# Patient Record
Sex: Female | Born: 1985 | Race: Black or African American | Hispanic: No | Marital: Married | Smoking: Current every day smoker
Health system: Southern US, Community
[De-identification: ages and names within clinical notes are randomized; demographics above are authoritative.]

## PROBLEM LIST (undated history)

## (undated) ENCOUNTER — Inpatient Hospital Stay (HOSPITAL_COMMUNITY): Payer: Self-pay

## (undated) DIAGNOSIS — F329 Major depressive disorder, single episode, unspecified: Secondary | ICD-10-CM

## (undated) DIAGNOSIS — F32A Depression, unspecified: Secondary | ICD-10-CM

## (undated) DIAGNOSIS — E039 Hypothyroidism, unspecified: Secondary | ICD-10-CM

## (undated) DIAGNOSIS — I10 Essential (primary) hypertension: Secondary | ICD-10-CM

## (undated) DIAGNOSIS — G039 Meningitis, unspecified: Secondary | ICD-10-CM

## (undated) DIAGNOSIS — N83209 Unspecified ovarian cyst, unspecified side: Secondary | ICD-10-CM

## (undated) DIAGNOSIS — E079 Disorder of thyroid, unspecified: Secondary | ICD-10-CM

## (undated) DIAGNOSIS — IMO0002 Reserved for concepts with insufficient information to code with codable children: Secondary | ICD-10-CM

## (undated) DIAGNOSIS — K219 Gastro-esophageal reflux disease without esophagitis: Secondary | ICD-10-CM

## (undated) DIAGNOSIS — T7840XA Allergy, unspecified, initial encounter: Secondary | ICD-10-CM

## (undated) DIAGNOSIS — R002 Palpitations: Secondary | ICD-10-CM

## (undated) DIAGNOSIS — F419 Anxiety disorder, unspecified: Secondary | ICD-10-CM

## (undated) DIAGNOSIS — N390 Urinary tract infection, site not specified: Secondary | ICD-10-CM

## (undated) HISTORY — PX: THERAPEUTIC ABORTION: SHX798

## (undated) HISTORY — PX: WISDOM TOOTH EXTRACTION: SHX21

## (undated) HISTORY — DX: Gastro-esophageal reflux disease without esophagitis: K21.9

## (undated) HISTORY — DX: Allergy, unspecified, initial encounter: T78.40XA

---
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
## 2003-01-16 ENCOUNTER — Other Ambulatory Visit: Admission: RE | Admit: 2003-01-16 | Discharge: 2003-01-16 | Payer: Self-pay | Admitting: Family Medicine

## 2003-01-16 ENCOUNTER — Encounter (INDEPENDENT_AMBULATORY_CARE_PROVIDER_SITE_OTHER): Payer: Self-pay | Admitting: *Deleted

## 2003-01-16 ENCOUNTER — Encounter: Admission: RE | Admit: 2003-01-16 | Discharge: 2003-01-16 | Payer: Self-pay | Admitting: Sports Medicine

## 2003-02-17 ENCOUNTER — Encounter: Admission: RE | Admit: 2003-02-17 | Discharge: 2003-02-17 | Payer: Self-pay | Admitting: Sports Medicine

## 2003-06-25 ENCOUNTER — Inpatient Hospital Stay (HOSPITAL_COMMUNITY): Admission: AD | Admit: 2003-06-25 | Discharge: 2003-06-25 | Payer: Self-pay | Admitting: Obstetrics & Gynecology

## 2003-07-30 ENCOUNTER — Inpatient Hospital Stay (HOSPITAL_COMMUNITY): Admission: AD | Admit: 2003-07-30 | Discharge: 2003-07-30 | Payer: Self-pay | Admitting: Family Medicine

## 2003-07-31 ENCOUNTER — Encounter: Admission: RE | Admit: 2003-07-31 | Discharge: 2003-07-31 | Payer: Self-pay | Admitting: Sports Medicine

## 2003-08-28 ENCOUNTER — Encounter (INDEPENDENT_AMBULATORY_CARE_PROVIDER_SITE_OTHER): Payer: Self-pay | Admitting: Specialist

## 2003-08-28 ENCOUNTER — Encounter: Admission: RE | Admit: 2003-08-28 | Discharge: 2003-08-28 | Payer: Self-pay | Admitting: Family Medicine

## 2003-10-21 ENCOUNTER — Inpatient Hospital Stay (HOSPITAL_COMMUNITY): Admission: AD | Admit: 2003-10-21 | Discharge: 2003-10-21 | Payer: Self-pay | Admitting: Obstetrics and Gynecology

## 2003-11-04 ENCOUNTER — Inpatient Hospital Stay (HOSPITAL_COMMUNITY): Admission: AD | Admit: 2003-11-04 | Discharge: 2003-11-04 | Payer: Self-pay | Admitting: Obstetrics and Gynecology

## 2003-11-10 ENCOUNTER — Ambulatory Visit: Payer: Self-pay | Admitting: Family Medicine

## 2003-11-11 ENCOUNTER — Ambulatory Visit (HOSPITAL_COMMUNITY): Admission: RE | Admit: 2003-11-11 | Discharge: 2003-11-11 | Payer: Self-pay

## 2003-12-08 ENCOUNTER — Ambulatory Visit: Payer: Self-pay | Admitting: Family Medicine

## 2003-12-18 ENCOUNTER — Inpatient Hospital Stay (HOSPITAL_COMMUNITY): Admission: AD | Admit: 2003-12-18 | Discharge: 2003-12-18 | Payer: Self-pay | Admitting: *Deleted

## 2003-12-31 ENCOUNTER — Ambulatory Visit: Payer: Self-pay | Admitting: Family Medicine

## 2004-01-14 ENCOUNTER — Ambulatory Visit: Payer: Self-pay | Admitting: Family Medicine

## 2004-01-21 ENCOUNTER — Inpatient Hospital Stay (HOSPITAL_COMMUNITY): Admission: AD | Admit: 2004-01-21 | Discharge: 2004-01-21 | Payer: Self-pay | Admitting: Obstetrics and Gynecology

## 2004-01-22 ENCOUNTER — Inpatient Hospital Stay (HOSPITAL_COMMUNITY): Admission: AD | Admit: 2004-01-22 | Discharge: 2004-01-22 | Payer: Self-pay | Admitting: *Deleted

## 2004-01-29 ENCOUNTER — Ambulatory Visit: Payer: Self-pay | Admitting: Family Medicine

## 2004-01-31 ENCOUNTER — Inpatient Hospital Stay (HOSPITAL_COMMUNITY): Admission: AD | Admit: 2004-01-31 | Discharge: 2004-02-03 | Payer: Self-pay | Admitting: Obstetrics & Gynecology

## 2004-02-01 ENCOUNTER — Encounter (INDEPENDENT_AMBULATORY_CARE_PROVIDER_SITE_OTHER): Payer: Self-pay | Admitting: *Deleted

## 2004-02-08 ENCOUNTER — Ambulatory Visit: Payer: Self-pay | Admitting: *Deleted

## 2004-02-08 ENCOUNTER — Inpatient Hospital Stay (HOSPITAL_COMMUNITY): Admission: AD | Admit: 2004-02-08 | Discharge: 2004-02-10 | Payer: Self-pay | Admitting: Obstetrics and Gynecology

## 2004-09-07 ENCOUNTER — Inpatient Hospital Stay (HOSPITAL_COMMUNITY): Admission: AD | Admit: 2004-09-07 | Discharge: 2004-09-07 | Payer: Self-pay | Admitting: Obstetrics and Gynecology

## 2004-09-08 ENCOUNTER — Encounter (INDEPENDENT_AMBULATORY_CARE_PROVIDER_SITE_OTHER): Payer: Self-pay | Admitting: *Deleted

## 2004-09-08 ENCOUNTER — Ambulatory Visit: Payer: Self-pay | Admitting: Family Medicine

## 2004-09-08 ENCOUNTER — Other Ambulatory Visit: Admission: RE | Admit: 2004-09-08 | Discharge: 2004-09-08 | Payer: Self-pay | Admitting: Family Medicine

## 2004-11-10 ENCOUNTER — Ambulatory Visit: Payer: Self-pay | Admitting: Family Medicine

## 2005-04-06 ENCOUNTER — Inpatient Hospital Stay (HOSPITAL_COMMUNITY): Admission: AD | Admit: 2005-04-06 | Discharge: 2005-04-06 | Payer: Self-pay | Admitting: Obstetrics & Gynecology

## 2005-08-17 ENCOUNTER — Inpatient Hospital Stay (HOSPITAL_COMMUNITY): Admission: AD | Admit: 2005-08-17 | Discharge: 2005-08-17 | Payer: Self-pay | Admitting: Gynecology

## 2005-08-20 ENCOUNTER — Ambulatory Visit: Payer: Self-pay | Admitting: Family Medicine

## 2005-08-20 ENCOUNTER — Ambulatory Visit: Payer: Self-pay | Admitting: Neonatology

## 2005-08-20 ENCOUNTER — Inpatient Hospital Stay (HOSPITAL_COMMUNITY): Admission: AD | Admit: 2005-08-20 | Discharge: 2005-08-22 | Payer: Self-pay | Admitting: Obstetrics and Gynecology

## 2005-09-07 ENCOUNTER — Ambulatory Visit: Payer: Self-pay | Admitting: Gynecology

## 2005-09-07 ENCOUNTER — Inpatient Hospital Stay (HOSPITAL_COMMUNITY): Admission: AD | Admit: 2005-09-07 | Discharge: 2005-09-09 | Payer: Self-pay | Admitting: Obstetrics and Gynecology

## 2005-09-07 ENCOUNTER — Encounter (INDEPENDENT_AMBULATORY_CARE_PROVIDER_SITE_OTHER): Payer: Self-pay | Admitting: Specialist

## 2005-11-23 ENCOUNTER — Ambulatory Visit: Payer: Self-pay | Admitting: Sports Medicine

## 2006-01-11 ENCOUNTER — Ambulatory Visit: Payer: Self-pay | Admitting: Sports Medicine

## 2006-02-15 ENCOUNTER — Ambulatory Visit: Payer: Self-pay | Admitting: Sports Medicine

## 2006-04-23 ENCOUNTER — Ambulatory Visit: Payer: Self-pay | Admitting: Family Medicine

## 2006-04-26 DIAGNOSIS — F17200 Nicotine dependence, unspecified, uncomplicated: Secondary | ICD-10-CM

## 2006-04-27 ENCOUNTER — Encounter (INDEPENDENT_AMBULATORY_CARE_PROVIDER_SITE_OTHER): Payer: Self-pay | Admitting: *Deleted

## 2006-04-29 ENCOUNTER — Inpatient Hospital Stay (HOSPITAL_COMMUNITY): Admission: AD | Admit: 2006-04-29 | Discharge: 2006-04-29 | Payer: Self-pay | Admitting: Obstetrics & Gynecology

## 2006-05-14 ENCOUNTER — Ambulatory Visit: Payer: Self-pay | Admitting: Sports Medicine

## 2006-06-30 ENCOUNTER — Inpatient Hospital Stay (HOSPITAL_COMMUNITY): Admission: AD | Admit: 2006-06-30 | Discharge: 2006-06-30 | Payer: Self-pay | Admitting: Obstetrics & Gynecology

## 2006-07-04 ENCOUNTER — Telehealth (INDEPENDENT_AMBULATORY_CARE_PROVIDER_SITE_OTHER): Payer: Self-pay | Admitting: Family Medicine

## 2006-07-17 ENCOUNTER — Telehealth: Payer: Self-pay | Admitting: *Deleted

## 2006-10-03 ENCOUNTER — Ambulatory Visit: Payer: Self-pay | Admitting: Family Medicine

## 2006-10-03 LAB — CONVERTED CEMR LAB: Beta hcg, urine, semiquantitative: NEGATIVE

## 2006-10-16 ENCOUNTER — Ambulatory Visit: Payer: Self-pay | Admitting: Family Medicine

## 2006-10-16 LAB — CONVERTED CEMR LAB: Beta hcg, urine, semiquantitative: NEGATIVE

## 2007-01-18 ENCOUNTER — Ambulatory Visit: Payer: Self-pay | Admitting: Family Medicine

## 2007-01-18 LAB — CONVERTED CEMR LAB: Beta hcg, urine, semiquantitative: NEGATIVE

## 2007-04-24 ENCOUNTER — Telehealth: Payer: Self-pay | Admitting: *Deleted

## 2007-04-25 ENCOUNTER — Ambulatory Visit: Payer: Self-pay | Admitting: Family Medicine

## 2007-04-25 LAB — CONVERTED CEMR LAB: Beta hcg, urine, semiquantitative: NEGATIVE

## 2007-05-01 ENCOUNTER — Ambulatory Visit: Payer: Self-pay | Admitting: Family Medicine

## 2007-05-03 ENCOUNTER — Telehealth: Payer: Self-pay | Admitting: *Deleted

## 2007-09-03 ENCOUNTER — Ambulatory Visit: Payer: Self-pay | Admitting: Family Medicine

## 2007-09-03 LAB — CONVERTED CEMR LAB: Beta hcg, urine, semiquantitative: NEGATIVE

## 2007-12-03 ENCOUNTER — Telehealth (INDEPENDENT_AMBULATORY_CARE_PROVIDER_SITE_OTHER): Payer: Self-pay | Admitting: *Deleted

## 2007-12-04 ENCOUNTER — Other Ambulatory Visit: Admission: RE | Admit: 2007-12-04 | Discharge: 2007-12-04 | Payer: Self-pay | Admitting: Sports Medicine

## 2007-12-04 ENCOUNTER — Encounter: Payer: Self-pay | Admitting: Family Medicine

## 2007-12-04 ENCOUNTER — Ambulatory Visit: Payer: Self-pay | Admitting: Family Medicine

## 2007-12-04 DIAGNOSIS — R5381 Other malaise: Secondary | ICD-10-CM

## 2007-12-04 DIAGNOSIS — R5383 Other fatigue: Secondary | ICD-10-CM

## 2007-12-04 LAB — CONVERTED CEMR LAB: Whiff Test: POSITIVE

## 2007-12-05 LAB — CONVERTED CEMR LAB
ALT: 12 units/L (ref 0–35)
AST: 15 units/L (ref 0–37)
Albumin: 4.2 g/dL (ref 3.5–5.2)
Alkaline Phosphatase: 90 units/L (ref 39–117)
BUN: 13 mg/dL (ref 6–23)
CO2: 21 meq/L (ref 19–32)
Calcium: 9.5 mg/dL (ref 8.4–10.5)
Chlamydia, DNA Probe: NEGATIVE
Chloride: 108 meq/L (ref 96–112)
Creatinine, Ser: 0.82 mg/dL (ref 0.40–1.20)
Direct LDL: 76 mg/dL
GC Probe Amp, Genital: NEGATIVE
Glucose, Bld: 83 mg/dL (ref 70–99)
HCT: 41 % (ref 36.0–46.0)
Hemoglobin: 13.9 g/dL (ref 12.0–15.0)
MCHC: 33.9 g/dL (ref 30.0–36.0)
MCV: 94 fL (ref 78.0–100.0)
Platelets: 230 10*3/uL (ref 150–400)
Potassium: 4.2 meq/L (ref 3.5–5.3)
RBC: 4.36 M/uL (ref 3.87–5.11)
RDW: 12.5 % (ref 11.5–15.5)
Sodium: 140 meq/L (ref 135–145)
TSH: 0.834 microintl units/mL (ref 0.350–4.50)
Total Bilirubin: 0.4 mg/dL (ref 0.3–1.2)
Total Protein: 6.9 g/dL (ref 6.0–8.3)
WBC: 5 10*3/uL (ref 4.0–10.5)

## 2007-12-09 ENCOUNTER — Telehealth: Payer: Self-pay | Admitting: *Deleted

## 2007-12-09 LAB — CONVERTED CEMR LAB: Pap Smear: NORMAL

## 2007-12-22 ENCOUNTER — Telehealth: Payer: Self-pay | Admitting: Family Medicine

## 2008-04-02 ENCOUNTER — Inpatient Hospital Stay (HOSPITAL_COMMUNITY): Admission: AD | Admit: 2008-04-02 | Discharge: 2008-04-02 | Payer: Self-pay | Admitting: Obstetrics & Gynecology

## 2008-04-27 ENCOUNTER — Ambulatory Visit: Payer: Self-pay | Admitting: Family Medicine

## 2008-04-29 ENCOUNTER — Ambulatory Visit: Payer: Self-pay | Admitting: Family Medicine

## 2008-05-14 ENCOUNTER — Inpatient Hospital Stay (HOSPITAL_COMMUNITY): Admission: AD | Admit: 2008-05-14 | Discharge: 2008-05-14 | Payer: Self-pay | Admitting: Family Medicine

## 2008-06-12 ENCOUNTER — Ambulatory Visit: Payer: Self-pay | Admitting: Family Medicine

## 2008-06-12 LAB — CONVERTED CEMR LAB: Beta hcg, urine, semiquantitative: NEGATIVE

## 2008-08-13 ENCOUNTER — Telehealth (INDEPENDENT_AMBULATORY_CARE_PROVIDER_SITE_OTHER): Payer: Self-pay | Admitting: *Deleted

## 2008-09-14 ENCOUNTER — Emergency Department (HOSPITAL_COMMUNITY): Admission: EM | Admit: 2008-09-14 | Discharge: 2008-09-14 | Payer: Self-pay | Admitting: Emergency Medicine

## 2008-10-23 ENCOUNTER — Emergency Department (HOSPITAL_COMMUNITY): Admission: EM | Admit: 2008-10-23 | Discharge: 2008-10-23 | Payer: Self-pay | Admitting: Emergency Medicine

## 2008-10-27 ENCOUNTER — Ambulatory Visit: Payer: Self-pay | Admitting: Family Medicine

## 2008-10-27 ENCOUNTER — Encounter: Payer: Self-pay | Admitting: *Deleted

## 2008-10-29 DIAGNOSIS — S301XXA Contusion of abdominal wall, initial encounter: Secondary | ICD-10-CM

## 2008-10-29 DIAGNOSIS — S20219A Contusion of unspecified front wall of thorax, initial encounter: Secondary | ICD-10-CM

## 2009-02-17 ENCOUNTER — Telehealth: Payer: Self-pay | Admitting: Family Medicine

## 2009-05-28 ENCOUNTER — Encounter: Payer: Self-pay | Admitting: *Deleted

## 2010-03-29 NOTE — Letter (Signed)
Summary: Probation Letter  [facility]  [address]   [place], [place] [address]   Phone: [phone]  Fax: [phone]    05/28/2009  Laurie Hall [address] [place], [place]  [address]  Dear [name],  With the goal of better serving all our patients the [facility] is following each patient's missed appointments.  You have missed at least 3 appointments with our practice.If you cannot keep your appointment, we expect you to call at least 24 hours before your appointment time.  Missing appointments prevents other patients from seeing [place] and makes it difficult to provide you with the best possible medical care.      1.   If you miss one more appointment, we will only give you limited medical services. This means we will not call in medication refills, complete a form, or make a referral for you except when you are here for a scheduled office visit.    2.   If you miss 2 or more appointments in the next year, we will dismiss you from our practice.    Our office staff can be reached at [phone] Monday through Friday from 8:30 a.m.-5:00 p.m. and will be glad to schedule your appointment as necessary.    Thank you.   [facility]  Appended Document: Probation Letter Letter returned unable to forward.

## 2010-06-05 LAB — URINALYSIS, ROUTINE W REFLEX MICROSCOPIC
Bilirubin Urine: NEGATIVE
Ketones, ur: NEGATIVE mg/dL
Nitrite: NEGATIVE
Protein, ur: NEGATIVE mg/dL
Specific Gravity, Urine: >1.03 — ABNORMAL HIGH (ref 1.005–1.030)
Urobilinogen, UA: 0.2 mg/dL (ref 0.0–1.0)

## 2010-06-05 LAB — POCT PREGNANCY, URINE: Preg Test, Ur: NEGATIVE

## 2010-06-14 LAB — DIFFERENTIAL
Basophils Absolute: 0 10*3/uL (ref 0.0–0.1)
Eosinophils Relative: 0 % (ref 0–5)
Lymphocytes Relative: 30 % (ref 12–46)
Lymphs Abs: 1.6 10*3/uL (ref 0.7–4.0)
Neutro Abs: 3.4 10*3/uL (ref 1.7–7.7)
Neutrophils Relative %: 63 % (ref 43–77)

## 2010-06-14 LAB — POCT PREGNANCY, URINE: Preg Test, Ur: NEGATIVE

## 2010-06-14 LAB — URINE MICROSCOPIC-ADD ON

## 2010-06-14 LAB — CBC
HCT: 40.7 % (ref 36.0–46.0)
Platelets: 183 10*3/uL (ref 150–400)
RDW: 12.5 % (ref 11.5–15.5)
WBC: 5.4 10*3/uL (ref 4.0–10.5)

## 2010-06-14 LAB — URINALYSIS, ROUTINE W REFLEX MICROSCOPIC
Glucose, UA: NEGATIVE mg/dL
Ketones, ur: NEGATIVE mg/dL
Leukocytes, UA: NEGATIVE
Nitrite: NEGATIVE
Specific Gravity, Urine: 1.025 (ref 1.005–1.030)
pH: 6 (ref 5.0–8.0)

## 2010-07-15 NOTE — Discharge Summary (Signed)
NAME:  Laurie Hall, Laurie Hall           ACCOUNT NO.:  [id number]   MEDICAL RECORD NO.:  [id number]          PATIENT TYPE:  INP   LOCATION:  9158                          [facility]ILITY[facility]  [facility]   PHYSICIAN:  Angie B. Merlene Morse, MD  DATE OF BIRTH:  07/02/1985   DATE OF ADMISSION:  08/20/2005  DATE OF DISCHARGE:  08/22/2005                                 DISCHARGE SUMMARY   ADMITTING DIAGNOSES:  1.  Vaginal 27 and 5 week intrauterine pregnancy.  2.  Vaginal bleeding.  3.  Pre-term contractions.   DISCHARGE DIAGNOSES:  1.  Vaginal bleeding, resolved.  2.  Pre-term contractions, resolved.  3.  Abnormal one hour glucose tolerance test.  4.  Chlamydia.  5.  Gonorrhea.   ADMITTING HISTORY AND PHYSICAL:  Patient is a 25 year old G31, P1-2-0-2, at  46 and 5 with an EDC of September 18 by an 8 week ultrasound who stated that  she was resting, got up to use the bathroom and something that looked like a  piece of liver came out and she had some vaginal bleeding associated with  it.  She also started that she is having cramping afterwards and the vaginal  bleeding resolved.  No rupture of membranes, good fetal activity.  No  prenatal care.   MEDICATIONS:  None.   ALLERGIES:  NO KNOWN DRUG ALLERGIES.   OB HISTORY:  IUFD at 6 months at home.  P2 38-1/2 weeker SVD.  Daughter has  arrhythmia.  P3 33 week SVD complicated by a placental abruption.   GYN HISTORY:  Trich treated on June 21.  Gonorrhea, Chlamydia and abnormal  Pap x1.   MED HISTORY:  None.   SOCIAL HISTORY:  Tobacco use.  Denies ethanol and drugs.   PRENATAL LABS:  Hemoglobin 10.8, platelets 222, Rubella immune, Hepatitis B  negative, Syphilis nonreactive.  HIV nonreactive.  Blood type AB negative.   PHYSICAL EXAM:  Afebrile.  Vital signs stable.  A well-developed, well-  nourished female in no acute distress.  Normal S1, S2.  No murmur, gallops  or rubs.  CHEST:  Clear to auscultation bilaterally.  ABDOMEN:  Gravid.   SPECULUM  EXAM:  Notable for some orange discharge, no obvious blood in the  vault.   STERILE VAGINAL EXAM:  A tight 1, 30%, -2 with a recheck at 2 hours later  with no change.  She was contracting 4-7 minutes on admission.   HOSPITAL COURSE:  The patient was admitted, she was given IV fluids, she was  given betamethasone.  She received Procardia as she got her two doses of  betamethasone and patient did have a 1 hour glucose tolerance test which was  abnormal at 141, this was done before she received betamethasone.  Patient  complained of no further bleeding throughout her hospitalization.  She did  have a repeat vaginal exam done at the time of discharge and she was noted  to be closed, long and high and __________ was done as well.  Patient's  contractions subsided.  Her Procardia was discontinued at 5 a.m. on the day  of discharge and patient had no contractions  after that.  RhoGAM was given.  Patient was treated with Rocephin and Zithromax for her gonorrhea and  Chlamydia.   LABS:  GBS positive, AB negative.  DSE panel:  PT 13.5, INR 1.0, fibrinogen  399, D-dimer 0.63.  __________ negative.  Antiphospholipid workup pending at  the time of discharge.  One hour glucose tolerance test 141.  Urine drug  screen negative.  UA greater 80 protein, a few epithelials, a few bacteria,  greater than 80 ketones.  Wet preparation negative.  GC positive.  Chlamydia  positive.   DISCHARGE DISPOSITION:  Patient was discharged to home.  She was to follow  up in clinic on July 11th and it was felt the patient would benefit for  evaluation directly in high risk clinic but patient would not stay to have  her Hollisters completed.  The patient was on modified bedrest.  The patient  was to return for any signs of bleeding or contractions.  Patient did have  an antiphospholipid antibody workup done that was pending at the time of  discharge as well as an FFN.  Patient was to follow up on Monday, July 2nd,  at 8  a.m., for a 3 hour glucose tolerance test at [facility].           ______________________________  August Saucer. Merlene Morse, MD     ABC/MEDQ  D:  08/22/2005  T:  08/22/2005  Job:  [id number]   cc:   [facility]

## 2010-07-15 NOTE — Discharge Summary (Signed)
[facility]  Patient:    Laurie Hall, Laurie Hall Visit Number: [id number] MRN: [id number]          Service Type: GYN Location: 910A 9121 01 Attending Physician:  Michaelle Copas Dictated by:   Lucille Passy, M.D. Admit Date:  04/19/2001 Discharge Date: 04/21/2001   CC:         Laurie Hall, M.D.   Discharge Summary  DATE OF BIRTH:                12/17/1982.  DISCHARGE DIAGNOSES:          1. Fever of unknown origin, likely viral                                  syndrome.                               2. Status post normal spontaneous vaginal                                  delivery on April 14, 2001.  DISCHARGE MEDICATIONS:        1. Ceftin 200 mg p.o. b.i.d. for seven days.                               2. Motrin p.r.n.                               3. Robitussin p.r.n.  OBSTETRICAL HISTORY:          The patient is a G2, P1-0-1-1. NSVD April 14, 2001, IUFD delivered at home at 20 to 24 weeks on December 2001.  GYNECOLOGICAL HISTORY:        History of Chlamydia treated April 2002.  PAST MEDICAL HISTORY:         Noncontributory.  PAST SURGICAL HISTORY:        None.  SOCIAL HISTORY:               Lives with mother, brother, newborn baby, father of the baby involved. No smoking, alcohol, or drugs.  ALLERGIES:                    No known drug allergies.  BRIEF HOSPITAL COURSE:        Dominic Rhome is a 25 year old African-American G2, P1-0-1-1, female who presented to the [facility] at postpartum day #5 after an NSVD with new onset of fever and back pain. She described spine pain radiating up the spine to her head in an occipital headache, could not stand up straight or walk well, as well as passing large blood clots x 6 on the day of admission. She was febrile on admission at 100.8. Several consults were involved including anesthesia (Dr. Pamalee Leyden), neurology (Dr. Orlin Hilding), Dr. Orlene Erm in OB/GYN,  infectious disease (Dr. Ninetta Lights) and radiology at the [facility]. A CT of the head without contrast was unremarkable, no evidence of increased intracranial pressure. A CT of the lumbar spine with and without contrast showed minimal disc bulge at the L2-3 level, no evidence of epidural abscess or epidural hematoma. The patient had had an epidural with her  recent delivery. An ultrasound of the pelvis was negative. The patient was initially admitted on vancomycin, clindamycin, ceftriaxone as well as Motrin, Tylenol, prenatal vitamins and IV fluids. She was also initially placed on contact precautions for the concern of meningitis. The initial differential diagnosis included retained products versus adnexal abscess, septic thrombophlebitis, meningitis, epidural abscess, viral syndrome. Pelvic ultrasound ruled out routine products and pelvic abscess as above, CT of the spine as above rule out epidural abscess. The patient rapidly improved during hospitalization. Blood cultures x 2 were negative to date at the time of dictation and a urine culture was negative during hospitalization. The patient remained afebrile after admission throughout hospitalization. The patient was seen by neurology and infectious disease during hospitalization who agreed with overall treatment and plan.  By hospital day #2 the patient was feeling much better, denied headache or other discomfort, maximum temperature 99.0. The most likely etiology for the patients symptoms is simply a viral syndrome given her rapid recovery. The vancomycin and clindamycin were actually discontinued on hospital day #1. The patient will be discharged on Ceftin at the above dosage.  DISCHARGE FOLLOWUP:           The patient will follow up with Dr. Earma Reading within one to two weeks.  DISCHARGE INSTRUCTIONS:       Activity and diet as tolerated. The patient was told that she could resume breast-feeding. She was told to  return for increasing temperature or symptoms. Dictated by:   Lucille Passy, M.D. Attending Physician:  Michaelle Copas DD:  04/20/01 TD:  04/21/01 Job: [id number]

## 2010-07-15 NOTE — Discharge Summary (Signed)
[name], Hall           ACCOUNT NO.:  [id number]   MEDICAL RECORD NO.:  [id number]          PATIENT TYPE:  INP   LOCATION:  9303                          [facility]   PHYSICIAN:  Lesly Dukes, M.D. DATE OF BIRTH:  October 31, 1985   DATE OF ADMISSION:  01/31/2004  DATE OF DISCHARGE:  02/03/2004                                 DISCHARGE SUMMARY   DISCHARGE DIAGNOSES:  1.  Vaginal delivery of a single infant.  2.  Preterm delivery.  3.  Postpartum anemia.  4.  Placental abruption.   DISCHARGE MEDICATIONS:  Ibuprofen 800 mg one p.o. q.8h p.r.n.   DISCHARGE INSTRUCTIONS:  Routine.   FOLLOW UP:  In six weeks at [facility] with Laurie Hall, M.D.   HOSPITAL COURSE:  Laurie Hall is a 25 year old, African-American female who  is a gravida 3, para 1-0-1-1, who presented at 31 weeks based on a due date  of March 19, 2004, with decreased fetal movement and vaginal bleeding.  She had an ultrasound which showed a partial placental abruption.  She had  been given a dose of betamethasone two weeks prior.  She continued to have  contractions into the afternoon.  Fetal heart tones started to show some  slight variability, but no decelerations and it was decided to augment labor  with Pitocin.  She was started on ampicillin IV for GBS prophylaxis.  The  patient was counseled about the need for emergency c-section if fetal  distress occurred.  A fetal scalp electrode was applied.  The patient pushed  and delivered a viable female infant from an LOA presentation.  The placenta  was delivered spontaneously and intact with a three- vessel cord.  Apgars  were 8 at one minute and 8 at five minutes.  Cord pH was 7.33 and the  placenta was also sent to pathology.  The pathology of the placenta showed a  third trimester placental disk that was 398 grams with acute subchorionitis.  There was a three-vessel umbilical cord, and extraplacental fetal membranes  with acute chorionitis.  Otherwise no abnormalities seen.  The infant was  taken to the NICU.   ASSESSMENT:  1.  Partial placental abruption.  The patient delivered a viable infant      without any difficulty and there was no abnormal pathology.  2.  Laurie Hall delivery of an infant and the infant was taken to the NICU.  3.  Contraception counseling.  The patient was given Depo before discharge.  4.  Postpartum anemia.  She had a hemoglobin of 11 which was stable.  5.  The patient is to follow up in six weeks.     CM/MEDQ  D:  04/27/2004  T:  04/28/2004  Job:  [id number]

## 2010-07-15 NOTE — Discharge Summary (Signed)
[facility]  Patient:    Laurie Hall, Laurie Hall Visit Number: [id number] MRN: [id number]          Service Type: GYN Location: 910A 9121 01 Attending Physician:  Michaelle Copas Dictated by:   Ed Blalock. Burnadette Peter, M.D. Admit Date:  04/19/2001 Discharge Date: 04/21/2001                             Discharge Summary  HISTORY OF PRESENT ILLNESS:   A 25 year old G2 P0-1-0-0 presented initially at 76 and six-sevenths weeks dated by her last menstrual period, confirmed by early ultrasound.  The patient was in for routine ultrasound secondary to intrauterine growth restriction and fetal tachycardia was noted as well as funneling of the cervix.  She was felt to have a fetal arrhythmia.  On repeat ultrasound she was noted to have irregular fetal heart rate from 132 to 260s with a funneling of her cervix from 1.2 to 1.6 cm.  She did have normal Dopplers.  Her primary care physician is Dr. Milinda Cave of the [facility].  Pregnancy is complicated by history of IUFD, history of chlamydia, teen pregnancy, Rubella not immune, and intrauterine growth restriction.  OBSTETRICAL HISTORY:          In 2001 she had a 20 to 24 week spontaneous vaginal delivery that was an intrauterine fetal demise.  PRENATAL LABORATORY:          Were done, and with the exception of rubella, were normal.  FINDINGS ON ADMISSION:     Cervical exam on admission showed positive vaginal thin white discharge.  Digital exam was 1 cm, 20-50%, and -2.  Fetal heart rate was irregular with decreased reactivity and variability.  Initial wet prep showed few yeast, few clues, and many wbcs and bacteria.  HOSPITAL COURSE:              The patient was initially admitted for her fetal tachycardia and arrhythmia.  She underwent an echocardiogram and was noted to have a fetal arrhythmia with small pericardial effusion.  On recommendation of pediatric cardiology she received IV digoxin load of  0.25 mg IV and then q.8h. x3.  Baseline maternal EKG was done and was normal.  She was ordered to be placed on digoxin 0.25 mg IV q.d. and the order was initially written to titrate digoxin to a maternal level of 2 unless serious side effects.  The hospital course was complicated by increasing intensity of uterine contractions.  She changed to a loose 2, 50-60%, and -1 on hospital day #2 but then her contractions decreased and she had no further cervical change despite continued contractions.  On hospital day #3, patient developed nausea and vomiting and since the fetal heart rate had stabilized to the 150s the patients digoxin was changed to 0.125 mg p.o. for less side effects.  She was continued on this dose in the hospital without further difficulties.  Fetal heart rate stabilized and remained in the 150s to 160s throughout the rest of the hospitalization.  The patient was discharged home on p.o. digoxin and prenatal vitamins.  DISCHARGE ACTIVITY:           Ad lib.  DISCHARGE DIET:               Regular.  DISCHARGE MEDICATIONS:        1. Digoxin 0.125 mg one p.o. q.d.  2. Prenatal vitamins one p.o. q.d.  DISCHARGE FOLLOW-UP:          She is to return to [facility] maternity admissions to undergo antenatal testing and to be seen.  DISCHARGE CONDITION:          Improved.  DISCHARGE DIAGNOSES:          1. Fetal tachycardia and arrhythmia, resolved.                               2. Preterm labor, stable.                               3. Intrauterine growth restriction, stable. Dictated by:   Ed Blalock. Burnadette Peter, M.D. Attending Physician:  Michaelle Copas DD:  05/08/01 TD:  05/10/01 Job: [id number]

## 2010-07-15 NOTE — Op Note (Signed)
NAME:  Laurie Hall, Laurie Hall NO.:  [id number]   MEDICAL RECORD NO.:  [id number]          PATIENT TYPE:  MAT   LOCATION:  MATC                          [facility]ILITY:  [facility]   PHYSICIAN:  Ginger Carne, MD  DATE OF BIRTH:  [birth year]   DATE OF PROCEDURE:  DATE OF DISCHARGE:                                 OPERATIVE REPORT   PREOPERATIVE DIAGNOSES:  1. 30 and 2 week intrauterine pregnancy.  2. Hourglassing membrane.  3. Breech presentation.   POSTOPERATIVE DIAGNOSES:  Same and delivery of a viable female infant.   PROCEDURE:  Primary classical C-section with Pfannenstiel skin incision.   SURGEON:  Blima Rich, M.D.   ASSISTANT:  Kathlyn Sacramento, M.D.   ESTIMATED BLOOD LOSS:  600 cc.   COMPLICATIONS:  No immediate complications.   ANESTHESIA:  Spinal.   SPECIMEN:  Cord blood placenta.   OPERATIVE FINDINGS:  Preterm infant female delivered in breech presentation.  Apgar 6 and 8.  Weight per delivery room record.  No gross abnormalities.  Baby cried spontaneously at delivery.  Amniotic fluid was clear.  3 vessel  cord central insertion apparent 20% abruption.  Uterus, tubes and ovaries  showed normal changes of pregnancy.   OP PROCEDURE:  The patient was prepped and draped in the usual fashion and  placed in the left lateral supine position.  Betadine solution used for  antiseptic.  The patient was catheterized prior to the procedure.  After  adequate spinal anesthesia a Pfannenstiel incision was made with the abdomen  open.  The uterus was incised vertically.  Baby delivered via breech  extraction.  Cord clamped and cut.  Infant given to the pediatric staff.  Apparent 20% abruption placenta removed manually.  Uterus inspected.  Closure of the uterine musculature in 1 layer with 0-Vicryl running  interlocking sutures with the superior 3/4 of the incision with horizontal  mattress sutures to close the lower 1/4 of the incision.  Bleeding points  hemostatically  checked.  Blood clots removed.  Closure of the fascia 1 layer  of 0-  Vicryl running suture.  The skin was closed with staple instrument.  Instrument and sponge count were correct x 2.  2 grams of Ancef given at  cord clamp.  The patient tolerated the procedure well.  Returned  postanesthesia recovery room in excellent condition.     ______________________________  August Saucer Merlene Morse, MD      Ginger Carne, MD  Electronically Signed    ABC/MEDQ  D:  09/07/2005  T:  09/07/2005  Job:  [id number]

## 2010-07-15 NOTE — Discharge Summary (Signed)
[facility]  Patient:    Laurie Hall, Laurie Hall Visit Number: [id number] MRN: [id number]          Service Type: Attending:  Conni Elliot, M.D. Dictated by:   Michell Heinrich, M.D. Adm. Date:  04/14/01 Disc. Date: 04/16/01                             Discharge Summary  ADMISSION DIAGNOSES:          1. A 37 week intrauterine pregnancy.                               2. History of in utero fetal supraventricular                                  tachycardia.                               3. Intrauterine growth retardation.                               4. Active labor.  DISCHARGE DIAGNOSES:          1. A 37 week intrauterine pregnancy.                               2. History of in utero fetal supraventricular                                  tachycardia.                               3. Intrauterine growth retardation.                               4. Active labor.                               5. Successful normal spontaneous vaginal                                  delivery of viable female.  DISCHARGE MEDICATIONS:        1. Prenatal vitamin one tab p.o. q.d.                               2. Ibuprofen 600 mg one tab q.6h. p.r.n.                                  for pain.  FOLLOW-UP:                    In six weeks at [facility] with Dr. Milinda Cave.  SERVICE:  Ob teaching service  ATTENDING:                    Conni Elliot, M.D.  RESIDENT:                     Michell Heinrich, M.D.  INTERN:                       Vear Clock, M.D.  CONSULTS:                     Pediatric cardiology, Dr. Candis Musa on                               April 14, 2001.  Consult was by phone.                               Reason for consult was history of in                               utero supraventricular tachycardia                               and recommendations were to have the          infant follow up in his office as an                               outpatient after loading digoxin in the                               newborn nursery.  PROCEDURES:                   None.  HISTORY AND PHYSICAL:         For complete H&P please see resident H&P in chart.  Briefly, this is a 25 year old, G2, P0-0-1-0 who presented at [redacted] weeks gestation in active labor.  History for this pregnancy was significant for in utero fetal SVT which was converted by giving the mother digoxin.  Mother and infant had been stable on digoxin.  Fetus was known IUGR and was being followed with ultrasound.  HOSPITAL COURSE:              The patient was admitted to L&D and appropriate prophylactic antibiotics were begun for group B Streptococcus positive status. On admission the infants heart rate was baseline in the 130s with good reactivity and no decelerations.  She was given an epidural and had adequate progression of active labor.  NICU was made aware of the patient and need to be a delivery for possible infant resuscitation.  The infant stayed stable during delivery and the patient had a normal spontaneous vaginal delivery of a viable female. Apgars 8 at one minute and 9 at five minutes.  Time of delivery was 1508.  Placenta delivered intact and two small right and left labial lacerations were repaired with 3-0 chromic interrupted sutures.  Estimated blood loss was 250 cc.  Postpartum course was uncomplicated.  The patient was given RhoGAM, rubella and Depo-Provera prior to discharge.  The  infant was discharged home with mother.  PERTINENT LABORATORY DATA:    Hemoglobin 10.8 predelivery and 10.1 post delivery. Dictated by:   Michell Heinrich, M.D. Attending:  Conni Elliot, M.D. DD:  07/25/01 TD:  07/27/01 Job: [id number]

## 2011-09-02 ENCOUNTER — Emergency Department (HOSPITAL_COMMUNITY)
Admission: EM | Admit: 2011-09-02 | Discharge: 2011-09-02 | Disposition: A | Discharge status: Home / Self Care | Payer: Self-pay | Attending: Emergency Medicine | Admitting: Emergency Medicine

## 2011-09-02 ENCOUNTER — Emergency Department (HOSPITAL_COMMUNITY): Payer: Self-pay

## 2011-09-02 ENCOUNTER — Encounter (HOSPITAL_COMMUNITY): Payer: Self-pay | Admitting: Emergency Medicine

## 2011-09-02 DIAGNOSIS — S0083XA Contusion of other part of head, initial encounter: Secondary | ICD-10-CM

## 2011-09-02 DIAGNOSIS — F17200 Nicotine dependence, unspecified, uncomplicated: Secondary | ICD-10-CM | POA: Insufficient documentation

## 2011-09-02 DIAGNOSIS — S0003XA Contusion of scalp, initial encounter: Secondary | ICD-10-CM | POA: Insufficient documentation

## 2011-09-02 MED ORDER — NAPROXEN 500 MG PO TABS: 500.0000 mg | ORAL_TABLET | Freq: Two times a day (BID) | ORAL | Status: DC
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
## 2012-02-20 ENCOUNTER — Inpatient Hospital Stay (HOSPITAL_COMMUNITY): Payer: Self-pay

## 2012-02-20 ENCOUNTER — Encounter (HOSPITAL_COMMUNITY): Payer: Self-pay

## 2012-02-20 ENCOUNTER — Inpatient Hospital Stay (HOSPITAL_COMMUNITY)
Admission: AD | Admit: 2012-02-20 | Discharge: 2012-02-20 | Disposition: A | Discharge status: Home / Self Care | Payer: Self-pay | Source: Ambulatory Visit | Attending: Obstetrics & Gynecology | Admitting: Obstetrics & Gynecology

## 2012-02-20 DIAGNOSIS — O200 Threatened abortion: Secondary | ICD-10-CM

## 2012-02-20 DIAGNOSIS — A5901 Trichomonal vulvovaginitis: Secondary | ICD-10-CM | POA: Insufficient documentation

## 2012-02-20 DIAGNOSIS — O30009 Twin pregnancy, unspecified number of placenta and unspecified number of amniotic sacs, unspecified trimester: Secondary | ICD-10-CM | POA: Insufficient documentation

## 2012-02-20 DIAGNOSIS — O98819 Other maternal infectious and parasitic diseases complicating pregnancy, unspecified trimester: Secondary | ICD-10-CM | POA: Insufficient documentation

## 2012-02-20 DIAGNOSIS — N949 Unspecified condition associated with female genital organs and menstrual cycle: Secondary | ICD-10-CM | POA: Insufficient documentation

## 2012-02-20 DIAGNOSIS — O26899 Other specified pregnancy related conditions, unspecified trimester: Secondary | ICD-10-CM

## 2012-02-20 DIAGNOSIS — R109 Unspecified abdominal pain: Secondary | ICD-10-CM | POA: Insufficient documentation

## 2012-02-20 HISTORY — DX: Unspecified ovarian cyst, unspecified side: N83.209

## 2012-02-20 HISTORY — DX: Reserved for concepts with insufficient information to code with codable children: IMO0002

## 2012-02-20 HISTORY — DX: Meningitis, unspecified: G03.9

## 2012-02-20 HISTORY — DX: Urinary tract infection, site not specified: N39.0

## 2012-02-20 LAB — WET PREP, GENITAL: Yeast Wet Prep HPF POC: NONE SEEN

## 2012-02-20 LAB — CBC WITH DIFFERENTIAL/PLATELET
Eosinophils Absolute: 0.1 10*3/uL (ref 0.0–0.7)
Eosinophils Relative: 1 % (ref 0–5)
HCT: 35.1 % — ABNORMAL LOW (ref 36.0–46.0)
Lymphocytes Relative: 40 % (ref 12–46)
Lymphs Abs: 2.6 10*3/uL (ref 0.7–4.0)
MCH: 33.2 pg (ref 26.0–34.0)
MCV: 92.6 fL (ref 78.0–100.0)
Monocytes Absolute: 0.5 10*3/uL (ref 0.1–1.0)
RBC: 3.79 MIL/uL — ABNORMAL LOW (ref 3.87–5.11)
RDW: 11.3 % — ABNORMAL LOW (ref 11.5–15.5)
WBC: 6.4 10*3/uL (ref 4.0–10.5)

## 2012-02-20 LAB — URINALYSIS, ROUTINE W REFLEX MICROSCOPIC
Bilirubin Urine: NEGATIVE
Hgb urine dipstick: NEGATIVE
Ketones, ur: NEGATIVE mg/dL
Nitrite: NEGATIVE
Specific Gravity, Urine: <1.005 — ABNORMAL LOW (ref 1.005–1.030)
Urobilinogen, UA: 0.2 mg/dL (ref 0.0–1.0)

## 2012-02-20 MED ORDER — METRONIDAZOLE 500 MG PO TABS: 500.0000 mg | ORAL_TABLET | Freq: Two times a day (BID) | ORAL | Status: DC

## 2012-02-20 NOTE — MAU Note (Signed)
Patient states she has had abdominal cramping for about 2 weeks. Clear vaginal discharge. Has had 4 positive home pregnancy tests.

## 2012-02-20 NOTE — MAU Provider Note (Signed)
History     CSN: [id number]  Arrival date and time: 02/20/12 1712   First Provider Initiated Contact with Patient 02/20/12 1806      Chief Complaint  Patient presents with  . Abdominal Cramping  . Vaginal Discharge  . Possible Pregnancy   HPI Laurie Hall is a 26 y.o. female @ [redacted]w[redacted]d gestation who presents to MAU with abdominal cramping. The cramping started a few days ago. Associated symptoms include vaginal discharge. She had 4 positive home pregnancy test but thought they were wrong.  The history was provided by the patient.  OB History    Grav Para Term Preterm Abortions TAB SAB Ect Mult Living   7 4 0 4 2 1 1 0 0 3       Past Medical History  Diagnosis Date  . Thyroid disorder   . Urinary tract infection   . Meningitis   . Ovarian cyst   . Abnormal Pap smear     x4, repeats ok    Past Surgical History  Procedure Date  . Cesarean section   . Therapeutic abortion     Family History  Problem Relation Age of Onset  . Other Neg Hx     History  Substance Use Topics  . Smoking status: Current Every Day Smoker -- 1.0 packs/day for 8 years    Types: Cigarettes  . Smokeless tobacco: Not on file  . Alcohol Use: Yes     Comment: social    Allergies: No Known Allergies  No prescriptions prior to admission    ROS: As stated in HPI Physical Exam   Blood pressure 124/70, pulse 128, temperature 98.4 F (36.9 C), temperature source Oral, resp. rate 16, height 5\' 8"  (1.727 m), weight 134 lb 6.4 oz (60.963 kg), last menstrual period 12/12/2011, SpO2 99.00%, unknown if currently breastfeeding.  Physical Exam  Nursing note and vitals reviewed. Constitutional: She is oriented to person, place, and time. She appears well-developed and well-nourished. No distress.  HENT:  Head: Normocephalic and atraumatic.  Eyes: EOM are normal.  Neck: Neck supple.  Cardiovascular:       tachycardia  Respiratory: Effort normal.  GI: Soft. There is no tenderness.   Genitourinary:       External genitalia without lesions. Frothy discharge vaginal vault. Cervix inflamed, closed, uterus size not consistent with dates, only slightly enlarged.  Musculoskeletal: Normal range of motion.  Neurological: She is alert and oriented to person, place, and time.  Skin: Skin is warm and dry.  Psychiatric: She has a normal mood and affect. Her behavior is normal. Judgment and thought content normal.   Results for orders placed during the hospital encounter of 02/20/12 (from the past 24 hour(s))  URINALYSIS, ROUTINE W REFLEX MICROSCOPIC     Status: Abnormal   Collection Time   02/20/12  5:30 PM      Component Value Range   Color, Urine YELLOW  YELLOW   APPearance CLEAR  CLEAR   Specific Gravity, Urine <1.005 (*) 1.005 - 1.030   pH 6.0  5.0 - 8.0   Glucose, UA NEGATIVE  NEGATIVE mg/dL   Hgb urine dipstick NEGATIVE  NEGATIVE   Bilirubin Urine NEGATIVE  NEGATIVE   Ketones, ur NEGATIVE  NEGATIVE mg/dL   Protein, ur NEGATIVE  NEGATIVE mg/dL   Urobilinogen, UA 0.2  0.0 - 1.0 mg/dL   Nitrite NEGATIVE  NEGATIVE   Leukocytes, UA NEGATIVE  NEGATIVE  POCT PREGNANCY, URINE     Status: Abnormal  Collection Time   02/20/12  5:39 PM      Component Value Range   Preg Test, Ur POSITIVE (*) NEGATIVE  CBC WITH DIFFERENTIAL     Status: Abnormal   Collection Time   02/20/12  6:25 PM      Component Value Range   WBC 6.4  4.0 - 10.5 K/uL   RBC 3.79 (*) 3.87 - 5.11 MIL/uL   Hemoglobin 12.6  12.0 - 15.0 g/dL   HCT 29.5 (*) 62.1 - 30.8 %   MCV 92.6  78.0 - 100.0 fL   MCH 33.2  26.0 - 34.0 pg   MCHC 35.9  30.0 - 36.0 g/dL   RDW 65.7 (*) 84.6 - 96.2 %   Platelets 210  150 - 400 K/uL   Neutrophils Relative 50  43 - 77 %   Neutro Abs 3.2  1.7 - 7.7 K/uL   Lymphocytes Relative 40  12 - 46 %   Lymphs Abs 2.6  0.7 - 4.0 K/uL   Monocytes Relative 8  3 - 12 %   Monocytes Absolute 0.5  0.1 - 1.0 K/uL   Eosinophils Relative 1  0 - 5 %   Eosinophils Absolute 0.1  0.0 - 0.7 K/uL    Basophils Relative 1  0 - 1 %   Basophils Absolute 0.0  0.0 - 0.1 K/uL  HCG, QUANTITATIVE, PREGNANCY     Status: Abnormal   Collection Time   02/20/12  6:25 PM      Component Value Range   hCG, Beta Chain, Quant, S 4754 (*) <5 mIU/mL  ABO/RH     Status: Normal   Collection Time   02/20/12  6:25 PM      Component Value Range   ABO/RH(D) AB NEG    WET PREP, GENITAL     Status: Abnormal   Collection Time   02/20/12  6:50 PM      Component Value Range   Yeast Wet Prep HPF POC NONE SEEN  NONE SEEN   Trich, Wet Prep FEW (*) NONE SEEN   Clue Cells Wet Prep HPF POC FEW (*) NONE SEEN   WBC, Wet Prep HPF POC FEW (*) NONE SEEN   US Ob Comp Less 14 Wks  02/20/2012  Clinical Data: pain in early preg,cramping; ;  OBSTETRICAL ULTRASOUND <14 WKS AND TRANSVAGINAL OB US:  Comparison: None  Number of Fetuses: None, but there are two gestational sacs present intrauterine in location. Yolk Sac:  None Embryo: None Cardiac Activity: No Heart Rate: Not applicablebpm  MEASUREMENTS: Mean Sac Diameter: 0.55 cm      5w  1d         [place] EDC:. 10/21/2012. Mean Sac Diameter:  0.34      .4.w  .6.         [place] EDC:10/23/2012  Subchorionic hemorrhage: Small  Maternal uterus/adnexae: Both ovaries appear normal.  IMPRESSION: Two intrauterine gestational sacs are present which are somewhat irregular with small subchorionic hemorrhage adjacent.  No yolk sac is seen and no embryo is noted.  Recommend follow-up with ultrasound and beta HCG to assess viability.   Original Report Authenticated By: Dwyane Dee, M.D.    US Ob Transvaginal  02/20/2012  Clinical Data: pain in early preg,cramping; ;  OBSTETRICAL ULTRASOUND <14 WKS AND TRANSVAGINAL OB US:  Comparison: None  Number of Fetuses: None, but there are two gestational sacs present intrauterine in location. Yolk Sac:  None Embryo: None Cardiac Activity: No Heart Rate: Not applicablebpm  MEASUREMENTS: Mean Sac Diameter: 0.55 cm      5w  1d         [place] EDC:. 10/21/2012. Mean Sac  Diameter:  0.34      .4.w  .6.         [place] EDC:10/23/2012  Subchorionic hemorrhage: Small  Maternal uterus/adnexae: Both ovaries appear normal.  IMPRESSION: Two intrauterine gestational sacs are present which are somewhat irregular with small subchorionic hemorrhage adjacent.  No yolk sac is seen and no embryo is noted.  Recommend follow-up with ultrasound and beta HCG to assess viability.   Original Report Authenticated By: Dwyane Dee, M.D.     Assessment: 26 y.o. female with abdominal cramping in early pregnancy   Twin gestation   Trichomonas vaginosis  Plan:  Follow up ultrasound in one week   Threatened AB instructions   Rx flagyl  I have reviewed this patient's vital signs, nurses notes, appropriate labs and imaging. I have discussed results with the patient and she voices understanding.    Medication List     As of 02/20/2012  8:08 PM    START taking these medications         metroNIDAZOLE 500 MG tablet   Commonly known as: FLAGYL   Take 1 tablet (500 mg total) by mouth 2 (two) times daily.          Where to get your medications    These are the prescriptions that you need to pick up. We sent them to a specific pharmacy, so you will need to go there to get them.   [facility] [address] [place], [place] - [address] [facility] [address]    [address]  [address]    Phone: [phone]        metroNIDAZOLE 500 MG tablet           Procedures  Child Campoy, RN, FNP, [facility] 02/20/2012, 7:56 PM

## 2012-02-20 NOTE — MAU Provider Note (Signed)
Attestation of Attending Supervision of Advanced Practitioner (PA/CNM/NP): Evaluation and management procedures were performed by the Advanced Practitioner under my supervision and collaboration.  I have reviewed the Advanced Practitioner's note and chart, and I agree with the management and plan.  Annaleia Pence, MD, FACOG Attending Obstetrician & Gynecologist Faculty Practice, [facility]  

## 2012-02-20 NOTE — MAU Note (Signed)
Has been having shooting pains in lower abd for past few wks.  Last preg had a c/s- 6 yrs ago, still has 'incisional pain', can't sleep on stomach, hurts at times when walks.

## 2012-02-22 LAB — GC/CHLAMYDIA PROBE AMP: GC Probe RNA: NEGATIVE

## 2012-02-26 ENCOUNTER — Inpatient Hospital Stay (HOSPITAL_COMMUNITY): Payer: Self-pay

## 2012-02-26 ENCOUNTER — Inpatient Hospital Stay (HOSPITAL_COMMUNITY)
Admission: AD | Admit: 2012-02-26 | Discharge: 2012-02-26 | Disposition: A | Discharge status: Home / Self Care | Payer: Self-pay | Source: Ambulatory Visit | Attending: Obstetrics and Gynecology | Admitting: Obstetrics and Gynecology

## 2012-02-26 ENCOUNTER — Encounter (HOSPITAL_COMMUNITY): Payer: Self-pay | Admitting: *Deleted

## 2012-02-26 DIAGNOSIS — O30009 Twin pregnancy, unspecified number of placenta and unspecified number of amniotic sacs, unspecified trimester: Secondary | ICD-10-CM | POA: Insufficient documentation

## 2012-02-26 DIAGNOSIS — O99891 Other specified diseases and conditions complicating pregnancy: Secondary | ICD-10-CM | POA: Insufficient documentation

## 2012-02-26 DIAGNOSIS — R109 Unspecified abdominal pain: Secondary | ICD-10-CM | POA: Insufficient documentation

## 2012-02-26 LAB — URINALYSIS, ROUTINE W REFLEX MICROSCOPIC
Bilirubin Urine: NEGATIVE
Ketones, ur: NEGATIVE mg/dL
Nitrite: NEGATIVE
Protein, ur: NEGATIVE mg/dL
Urobilinogen, UA: 0.2 mg/dL (ref 0.0–1.0)

## 2012-02-26 NOTE — MAU Provider Note (Signed)
Attestation of Attending Supervision of Advanced Practitioner (CNM/NP): Evaluation and management procedures were performed by the Advanced Practitioner under my supervision and collaboration.  I have reviewed the Advanced Practitioner's note and chart, and I agree with the management and plan.  Laurie Hall 02/26/2012 3:26 PM

## 2012-02-26 NOTE — MAU Note (Signed)
Cramping like she had when she came last wk,still having it- but it is getting worse, especially when she lays down, has been sleeping sitting up the last couple nights.
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
## 2012-02-27 ENCOUNTER — Ambulatory Visit (HOSPITAL_COMMUNITY): Admission: RE | Admit: 2012-02-27 | Payer: Self-pay | Source: Ambulatory Visit

## 2012-03-06 ENCOUNTER — Inpatient Hospital Stay (HOSPITAL_COMMUNITY)
Admission: AD | Admit: 2012-03-06 | Discharge: 2012-03-06 | Disposition: A | Discharge status: Home / Self Care | Payer: Self-pay | Source: Ambulatory Visit | Attending: Obstetrics and Gynecology | Admitting: Obstetrics and Gynecology

## 2012-03-06 ENCOUNTER — Encounter (HOSPITAL_COMMUNITY): Payer: Self-pay | Admitting: Advanced Practice Midwife

## 2012-03-06 ENCOUNTER — Ambulatory Visit (HOSPITAL_COMMUNITY)
Admission: RE | Admit: 2012-03-06 | Discharge: 2012-03-06 | Disposition: A | Discharge status: Home / Self Care | Payer: Self-pay | Source: Ambulatory Visit | Attending: Nurse Practitioner | Admitting: Nurse Practitioner

## 2012-03-06 DIAGNOSIS — J069 Acute upper respiratory infection, unspecified: Secondary | ICD-10-CM | POA: Insufficient documentation

## 2012-03-06 DIAGNOSIS — O99891 Other specified diseases and conditions complicating pregnancy: Secondary | ICD-10-CM | POA: Insufficient documentation

## 2012-03-06 DIAGNOSIS — Z3201 Encounter for pregnancy test, result positive: Secondary | ICD-10-CM

## 2012-03-06 DIAGNOSIS — O30009 Twin pregnancy, unspecified number of placenta and unspecified number of amniotic sacs, unspecified trimester: Secondary | ICD-10-CM | POA: Insufficient documentation

## 2012-03-06 DIAGNOSIS — O3680X0 Pregnancy with inconclusive fetal viability, not applicable or unspecified: Secondary | ICD-10-CM | POA: Insufficient documentation

## 2012-03-06 DIAGNOSIS — Z3689 Encounter for other specified antenatal screening: Secondary | ICD-10-CM | POA: Insufficient documentation

## 2012-03-06 NOTE — MAU Note (Signed)
Pt here for f/u u/s for viability only.

## 2012-03-06 NOTE — MAU Provider Note (Signed)
Attestation of Attending Supervision of Advanced Practitioner (CNM/NP): Evaluation and management procedures were performed by the Advanced Practitioner under my supervision and collaboration.  I have reviewed the Advanced Practitioner's note and chart, and I agree with the management and plan.  [name] 03/06/2012 1:00 PM   

## 2012-03-06 NOTE — MAU Note (Signed)
Denies pain or bleeding

## 2012-03-06 NOTE — MAU Provider Note (Signed)
History     CSN: [id number]  Arrival date and time: 03/06/12 0848   None     Chief Complaint  Patient presents with  . Follow-up ultrasound    HPI 27 y.o. [id number] at [redacted]w[redacted]d here for f/u u/s for viability. No pain or bleeding. Does c/o some mild cold symptoms and wants to know what she can take.   Past Medical History  Diagnosis Date  . Thyroid disorder   . Urinary tract infection   . Meningitis   . Ovarian cyst   . Abnormal Pap smear     x4, repeats ok    Past Surgical History  Procedure Date  . Cesarean section   . Therapeutic abortion     Family History  Problem Relation Age of Onset  . Other Neg Hx     History  Substance Use Topics  . Smoking status: Current Every Day Smoker -- 1.0 packs/day for 8 years    Types: Cigarettes  . Smokeless tobacco: Not on file  . Alcohol Use: Yes     Comment: social    Allergies: No Known Allergies  Prescriptions prior to admission  Medication Sig Dispense Refill  . metroNIDAZOLE (FLAGYL) 500 MG tablet Take 1 tablet (500 mg total) by mouth 2 (two) times daily.  14 tablet  0    Review of Systems  Constitutional: Negative.  Negative for fever and chills.  HENT: Positive for congestion.   Respiratory: Negative.  Negative for stridor.   Cardiovascular: Negative.   Gastrointestinal: Negative for nausea, vomiting, abdominal pain, diarrhea and constipation.  Genitourinary: Negative for dysuria, urgency, frequency, hematuria and flank pain.       Negative for vaginal bleeding, vaginal discharge  Musculoskeletal: Negative.   Neurological: Negative.   Psychiatric/Behavioral: Negative.    Physical Exam   Last menstrual period 12/12/2011.  Physical Exam  Nursing note and vitals reviewed. Constitutional: She is oriented to person, place, and time. She appears well-developed and well-nourished. No distress.  Cardiovascular: Normal rate.   Musculoskeletal: Normal range of motion.  Neurological: She is alert and oriented to  person, place, and time.  Skin: Skin is warm and dry.  Psychiatric: She has a normal mood and affect.    MAU Course  Procedures  U/S: 7.2 week twin IUP, + FHR x 2  Assessment and Plan   1. Twin pregnancy, antepartum   2. H/O preterm delivery 3. URI  - Gave pregnancy verification letter. Discussed importance of starting prenatal care as soon as possible, encouraged to schedule appointment with [facility] as soon as medicaid application process is initiated.  - Discussed increased risk of preterm delivery and encouraged 17P this pregnancy.  - Rev'd safe cold meds in pregnancy - Rev'd pregnancy precautions    Medication List     As of 03/06/2012  9:58 AM    CONTINUE taking these medications         metroNIDAZOLE 500 MG tablet   Commonly known as: FLAGYL   Take 1 tablet (500 mg total) by mouth 2 (two) times daily.            Follow-up Information    Follow up with [facility] [place]. (start prenatal care as soon as possible)    Contact information:   [address] [place] [address] [phone]      Follow up with [facility]. (call to schedule appointment once you have applied for medicaid)    Contact information:   [address] [place] [address] [phone]           Arafat Cocuzza 03/06/2012, 9:11 AM

## 2012-03-24 ENCOUNTER — Inpatient Hospital Stay (HOSPITAL_COMMUNITY): Payer: Self-pay

## 2012-03-24 ENCOUNTER — Encounter (HOSPITAL_COMMUNITY): Payer: Self-pay | Admitting: Obstetrics and Gynecology

## 2012-03-24 ENCOUNTER — Inpatient Hospital Stay (HOSPITAL_COMMUNITY)
Admission: AD | Admit: 2012-03-24 | Discharge: 2012-03-24 | Disposition: A | Discharge status: Hospice | Payer: Self-pay | Source: Ambulatory Visit | Attending: Obstetrics & Gynecology | Admitting: Obstetrics & Gynecology

## 2012-03-24 DIAGNOSIS — R109 Unspecified abdominal pain: Secondary | ICD-10-CM | POA: Insufficient documentation

## 2012-03-24 DIAGNOSIS — O26899 Other specified pregnancy related conditions, unspecified trimester: Secondary | ICD-10-CM

## 2012-03-24 DIAGNOSIS — O99891 Other specified diseases and conditions complicating pregnancy: Secondary | ICD-10-CM | POA: Insufficient documentation

## 2012-03-24 DIAGNOSIS — O30009 Twin pregnancy, unspecified number of placenta and unspecified number of amniotic sacs, unspecified trimester: Secondary | ICD-10-CM | POA: Insufficient documentation

## 2012-03-24 LAB — URINALYSIS, ROUTINE W REFLEX MICROSCOPIC
Hgb urine dipstick: NEGATIVE
Protein, ur: NEGATIVE mg/dL
Urobilinogen, UA: 0.2 mg/dL (ref 0.0–1.0)

## 2012-03-24 NOTE — MAU Provider Note (Signed)
Chief Complaint: Abdominal Cramping   First Provider Initiated Contact with Patient 03/24/12 1941      SUBJECTIVE HPI: Laurie Hall is a 27 y.o. [id number] at [redacted]w[redacted]d by LMP who presents with mild, intermittent cramping since last night that has worsened throughout the day today. 8/10 at worst. Live twin IUP verified by 7 week [place]. Denies VB, urinary complaints, N/V/D. Reports mild constipation, BMs EOD. GC/CT neg 02/24/12. No new partners. Has not taken anything for the pain and declines pain meds now.   Past Medical History  Diagnosis Date  . Urinary tract infection   . Meningitis   . Ovarian cyst   . Abnormal Pap smear     x4, repeats ok   OB History    Grav Para Term Preterm Abortions TAB SAB Ect Mult Living   7 4 1 3 2 1 1  0 0 3     # Outc Date GA Lbr Len/2nd Wgt Sex Del Anes PTL Lv   1 TRM  [redacted]w[redacted]d   F   Yes    2 PRE  [redacted]w[redacted]d   F   Yes    3 PRE  [redacted]w[redacted]d   M LTCS  Yes    4 SAB            5 TAB            6 PRE  [redacted]w[redacted]d       SB   Comments: +GBS, delviered at 6mon   7 CUR              Past Surgical History  Procedure Date  . Cesarean section   . Therapeutic abortion    History   Social History  . Marital Status: Single    Spouse Name: N/A    Number of Children: N/A  . Years of Education: N/A   Occupational History  . Not on file.   Social History Main Topics  . Smoking status: Current Every Day Smoker -- 1.0 packs/day for 8 years    Types: Cigarettes  . Smokeless tobacco: Never Used  . Alcohol Use: No     Comment: social  . Drug Use: No  . Sexually Active: Yes    Birth Control/ Protection: None     Comment: last 02/18/12   Other Topics Concern  . Not on file   Social History Narrative  . No narrative on file   No current facility-administered medications on file prior to encounter.   No current outpatient prescriptions on file prior to encounter.   No Known Allergies  ROS: Pertinent items in HPI  OBJECTIVE Blood pressure 113/69, pulse 92,  temperature 98.4 F (36.9 C), temperature source Oral, resp. rate 18, height 5\' 9"  (1.753 m), weight 62.596 kg (138 lb), last menstrual period 12/12/2011. GENERAL: Well-developed, well-nourished female in no acute distress.  HEENT: Normocephalic HEART: normal rate RESP: normal effort ABDOMEN: Soft, non-tender EXTREMITIES: Nontender, no edema NEURO: Alert and oriented SPECULUM EXAM: deferred  LAB RESULTS Results for orders placed during the hospital encounter of 03/24/12 (from the past 24 hour(s))  URINALYSIS, ROUTINE W REFLEX MICROSCOPIC     Status: Abnormal   Collection Time   03/24/12  6:00 PM      Component Value Range   Color, Urine YELLOW  YELLOW   APPearance CLEAR  CLEAR   Specific Gravity, Urine >1.030 (*) 1.005 - 1.030   pH 5.5  5.0 - 8.0   Glucose, UA NEGATIVE  NEGATIVE mg/dL   Hgb  urine dipstick NEGATIVE  NEGATIVE   Bilirubin Urine NEGATIVE  NEGATIVE   Ketones, ur 15 (*) NEGATIVE mg/dL   Protein, ur NEGATIVE  NEGATIVE mg/dL   Urobilinogen, UA 0.2  0.0 - 1.0 mg/dL   Nitrite NEGATIVE  NEGATIVE   Leukocytes, UA NEGATIVE  NEGATIVE    IMAGING   MAU COURSE Care of pt turned over to [facility], NP at 2000. Pt awaiting [place] for verify cardiac activity.   [place], [place] 03/24/2012 8:49 PM   ASSESSMENT No diagnosis found.  PLAN Care of patient assumed at 2000 pt. In ultrasound. US Ob Limited  03/24/2012  *RADIOLOGY REPORT*  Clinical Data: Worsening cramping, twin IUP.  OBSTETRIC <14 WK ULTRASOUND  Technique:  Transabdominal ultrasound was performed for evaluation of the gestation as well as the maternal uterus and adnexal regions.  Comparison:  None.  Twin A: Intrauterine gestational sac: Visualized/normal in shape. Embryo: Identified Cardiac Activity: Documented Heart Rate: 167 bpm  CRL: Not performed.  Twin B: Intrauterine gestational sac: Visualized/normal in shape. Embryo: Identified. Cardiac Activity: Identified Heart Rate: 174  bpm  CRL: Not performed.  Maternal  uterus/Adnexae: Not evaluated  IMPRESSION: Cardiac activity documented for both gestations as above.   Original Report Authenticated By: Jearld Lesch, M.D.     Patient without pain at this time. Discussed results of ultrasound. Patient plans to follow up with OB clinic here at [facility].   Assessment: 27 y.o. female with twin gestation @ [redacted]w[redacted]d with abdominal cramping.  Plan:  Start prenatal care, return as needed I have reviewed this patient's vital signs, nurses notes, appropriate labs and imaging.

## 2012-03-24 NOTE — MAU Note (Addendum)
Pt presents to MAU with chief complaint of lower abdominal cramping. It started last night and has gotten worse. Last intercourse was one month ago. Pt is G7P3 at [redacted]w[redacted]d pregnant with twins.

## 2012-03-24 NOTE — MAU Note (Signed)
"  I started having some mild cramping last night that would come and go.  Today it has been more constant and little more painful.  I am pregnant with twins.  Last intercourse was 02/18/12.  No VB."

## 2012-03-26 NOTE — MAU Provider Note (Signed)
Attestation of Attending Supervision of Advanced Practitioner (CNM/NP): Evaluation and management procedures were performed by the Advanced Practitioner under my supervision and collaboration.  I have reviewed the Advanced Practitioner's note and chart, and I agree with the management and plan.  HARRAWAY-SMITH, Kunio Cummiskey 9:14 AM

## 2012-04-16 ENCOUNTER — Encounter (HOSPITAL_COMMUNITY): Payer: Self-pay | Admitting: *Deleted

## 2012-04-16 ENCOUNTER — Inpatient Hospital Stay (HOSPITAL_COMMUNITY): Payer: Medicaid Other

## 2012-04-16 ENCOUNTER — Inpatient Hospital Stay (HOSPITAL_COMMUNITY)
Admission: AD | Admit: 2012-04-16 | Discharge: 2012-04-16 | Disposition: A | Discharge status: Home / Self Care | Payer: Medicaid Other | Source: Ambulatory Visit | Attending: Obstetrics & Gynecology | Admitting: Obstetrics & Gynecology

## 2012-04-16 DIAGNOSIS — R109 Unspecified abdominal pain: Secondary | ICD-10-CM | POA: Insufficient documentation

## 2012-04-16 DIAGNOSIS — O4692 Antepartum hemorrhage, unspecified, second trimester: Secondary | ICD-10-CM

## 2012-04-16 DIAGNOSIS — O209 Hemorrhage in early pregnancy, unspecified: Secondary | ICD-10-CM | POA: Insufficient documentation

## 2012-04-16 DIAGNOSIS — O4690 Antepartum hemorrhage, unspecified, unspecified trimester: Secondary | ICD-10-CM

## 2012-04-16 LAB — URINALYSIS, ROUTINE W REFLEX MICROSCOPIC
Hgb urine dipstick: NEGATIVE
Leukocytes, UA: NEGATIVE
Nitrite: NEGATIVE
Specific Gravity, Urine: 1.025 (ref 1.005–1.030)
Urobilinogen, UA: 0.2 mg/dL (ref 0.0–1.0)

## 2012-04-16 LAB — GC/CHLAMYDIA PROBE AMP
CT Probe RNA: NEGATIVE
GC Probe RNA: NEGATIVE

## 2012-04-16 LAB — WET PREP, GENITAL: Yeast Wet Prep HPF POC: NONE SEEN

## 2012-04-16 MED ORDER — ONDANSETRON 4 MG PO TBDP
4.0000 mg | ORAL_TABLET | Freq: Once | ORAL | Status: AC
  Administered 2012-04-16: 4 mg via ORAL
  Filled 2012-04-16: qty 1

## 2012-04-16 NOTE — MAU Provider Note (Signed)
History     CSN: [id number]  Arrival date and time: 04/16/12 0219   None     No chief complaint on file.  HPI  Laurie Hall is a 27 y.o. [id number] at [redacted]w[redacted]d who presents today via EMS with 10/10 lower abdominal pain and bleeding. She states that around 0100 she started having "severe" lower abdominal cramps and spotting when she wiped. She states that she thought her "stomach was balling up when she felt the pain". She has a poor OB history with a term stillbirth 2/2 GBS infection, and 3 prior preterm births. She has not started [facility] at this time. She is planing on coming to [facility] she states she is waiting for medicaid.   Past Medical History  Diagnosis Date  . Urinary tract infection   . Meningitis   . Ovarian cyst   . Abnormal Pap smear     x4, repeats ok    Past Surgical History  Procedure Laterality Date  . Cesarean section    . Therapeutic abortion      Family History  Problem Relation Age of Onset  . Other Neg Hx   . Hyperthyroidism Mother   . Heart disease Mother   . Hyperthyroidism Maternal Aunt   . Hypothyroidism Maternal Grandmother   . Heart murmur Maternal Grandmother   . Diabetes Maternal Grandmother   . Hyperthyroidism Maternal Grandfather     History  Substance Use Topics  . Smoking status: Current Every Day Smoker -- 1.00 packs/day for 8 years    Types: Cigarettes  . Smokeless tobacco: Never Used  . Alcohol Use: No     Comment: social    Allergies: No Known Allergies  Prescriptions prior to admission  Medication Sig Dispense Refill  . Prenatal Vit-Fe Fumarate-FA (PRENATAL MULTIVITAMIN) TABS Take 1 tablet by mouth at bedtime.        Review of Systems  Constitutional: Negative for fever and chills.  Eyes: Negative for blurred vision.  Gastrointestinal: Positive for abdominal pain. Negative for nausea, vomiting, diarrhea and constipation.  Genitourinary: Negative for dysuria, urgency and frequency.  Musculoskeletal: Negative for myalgias.   Neurological: Negative for dizziness and headaches.   Physical Exam   Blood pressure 107/66, pulse 96, temperature 98.2 F (36.8 C), temperature source Oral, height 5\' 9"  (1.753 m), weight 59.166 kg (130 lb 7 oz), last menstrual period 12/12/2011.  Physical Exam  Nursing note and vitals reviewed. Constitutional: She appears well-developed and well-nourished. No distress.  Cardiovascular: Normal rate.   GI: Soft. She exhibits no distension. There is no tenderness. There is no rebound.  Genitourinary:   External: normal Vagina: small amount of white discharge Cervix: pink, smooth, closed, no CMT Uterus: 13-15 weeks size   Neurological: She is alert.  Skin: Skin is warm and dry.  Psychiatric: She has a normal mood and affect.    MAU Course  Procedures  OB US showed viable IUP with 2 fetuses, + cardiac activity X 2, diamniotic, C/W LMP  Results for orders placed during the hospital encounter of 04/16/12 (from the past 24 hour(s))  URINALYSIS, ROUTINE W REFLEX MICROSCOPIC     Status: Abnormal   Collection Time    04/16/12  2:55 AM      Result Value Range   Color, Urine YELLOW  YELLOW   APPearance CLEAR  CLEAR   Specific Gravity, Urine 1.025  1.005 - 1.030   pH 6.0  5.0 - 8.0   Glucose, UA NEGATIVE  NEGATIVE mg/dL  Hgb urine dipstick NEGATIVE  NEGATIVE   Bilirubin Urine NEGATIVE  NEGATIVE   Ketones, ur 15 (*) NEGATIVE mg/dL   Protein, ur NEGATIVE  NEGATIVE mg/dL   Urobilinogen, UA 0.2  0.0 - 1.0 mg/dL   Nitrite NEGATIVE  NEGATIVE   Leukocytes, UA NEGATIVE  NEGATIVE  WET PREP, GENITAL     Status: Abnormal   Collection Time    04/16/12  3:07 AM      Result Value Range   Yeast Wet Prep HPF POC NONE SEEN  NONE SEEN   Trich, Wet Prep NONE SEEN  NONE SEEN   Clue Cells Wet Prep HPF POC FEW (*) NONE SEEN   WBC, Wet Prep HPF POC MODERATE (*) NONE SEEN     Assessment and Plan   1. Second trimester bleeding   Danger signs reviewed Encouraged patient to start [facility] ASAP,  message sent to scheduler   Tawnya Crook 04/16/2012, 3:09 AM

## 2012-04-16 NOTE — MAU Note (Signed)
PT ARRIVED VIA EMS-   SAYS  SHE HAS  LOWER ABD TIGHTENING -  STARTED Friday EVENING-   BUT TONIGHT BECAME WORSE-    VOMITED AT 0100 AND HAS HAD DIARRHEA- STARTED ON Monday .   NO PNC-  HAS BEEN IN MAU X4.   PLANS TO GET APPOINTMN=ENT  WITH CLINIC.  LAST SEX - 02-14-2012.    NOTICED  SPOTTING ON TOILET PAPER  WHEN VOMITING..   NO BLEEDING NOW.

## 2012-04-17 NOTE — MAU Provider Note (Signed)
Attestation of Attending Supervision of Advanced Practitioner (CNM/NP): Evaluation and management procedures were performed by the Advanced Practitioner under my supervision and collaboration.  I have reviewed the Advanced Practitioner's note and chart, and I agree with the management and plan.  HARRAWAY-SMITH, Dom Haverland 9:33 AM     

## 2012-05-02 ENCOUNTER — Other Ambulatory Visit (HOSPITAL_COMMUNITY)
Admission: RE | Admit: 2012-05-02 | Discharge: 2012-05-02 | Disposition: A | Discharge status: Home / Self Care | Payer: Self-pay | Source: Ambulatory Visit | Attending: Family Medicine | Admitting: Family Medicine

## 2012-05-02 ENCOUNTER — Ambulatory Visit (INDEPENDENT_AMBULATORY_CARE_PROVIDER_SITE_OTHER): Payer: Self-pay | Admitting: Family Medicine

## 2012-05-02 ENCOUNTER — Other Ambulatory Visit: Payer: Self-pay | Admitting: Family Medicine

## 2012-05-02 ENCOUNTER — Encounter: Payer: Self-pay | Admitting: Family Medicine

## 2012-05-02 VITALS — BP 109/70 | Wt 143.3 lb

## 2012-05-02 DIAGNOSIS — O34219 Maternal care for unspecified type scar from previous cesarean delivery: Secondary | ICD-10-CM

## 2012-05-02 DIAGNOSIS — Z01419 Encounter for gynecological examination (general) (routine) without abnormal findings: Secondary | ICD-10-CM | POA: Insufficient documentation

## 2012-05-02 DIAGNOSIS — O09219 Supervision of pregnancy with history of pre-term labor, unspecified trimester: Secondary | ICD-10-CM

## 2012-05-02 DIAGNOSIS — O30009 Twin pregnancy, unspecified number of placenta and unspecified number of amniotic sacs, unspecified trimester: Secondary | ICD-10-CM | POA: Insufficient documentation

## 2012-05-02 LAB — POCT URINALYSIS DIP (DEVICE)
Bilirubin Urine: NEGATIVE
Ketones, ur: NEGATIVE mg/dL
Nitrite: NEGATIVE

## 2012-05-02 MED ORDER — HYDROXYPROGESTERONE CAPROATE 250 MG/ML IM OIL: 250.0000 mg | TOPICAL_OIL | Freq: Once | INTRAMUSCULAR | Status: DC

## 2012-05-02 NOTE — Progress Notes (Signed)
Anatomy U/S scheduled with MFM for 05/22/12 at 8 am

## 2012-05-02 NOTE — Progress Notes (Signed)
Subjective:    Laurie Hall is a [id number] [redacted]w[redacted]d being seen today for her first obstetrical visit.  Her obstetrical history is significant for smoker and h/o PTB x 2, progressively younger gestational age, prior classical C-section, currently pregnant with DC/DA twins.. Patient does not intend to breast feed. Pregnancy history fully reviewed.  Patient reports no complaints.  Filed Vitals:   05/02/12 0820  BP: 109/70  Weight: 143 lb 4.8 oz (65 kg)    HISTORY: OB History   Grav Para Term Preterm Abortions TAB SAB Ect Mult Living   7 4 2 2 2 1 1  0 0 3     # Outc Date GA Lbr Len/2nd Wgt Sex Del Anes PTL Lv   1 TRM 12/01 [redacted]w[redacted]d   F SVD  Yes SB   2 TRM 2/03 [redacted]w[redacted]d  5lb5oz(2.41kg) F SVD  Yes Yes   3 PRE 12/05 [redacted]w[redacted]d  4lb5oz(1.956kg) M SVD  Yes Yes   4 PRE 7/07 [redacted]w[redacted]d  3lb2oz(1.417kg) M CCS  Yes Yes   5 SAB  [redacted]w[redacted]d          6 TAB  [redacted]w[redacted]d          7 CUR              Past Medical History  Diagnosis Date  . Urinary tract infection   . Meningitis   . Ovarian cyst   . Abnormal Pap smear     x4, repeats ok   Past Surgical History  Procedure Laterality Date  . Cesarean section    . Therapeutic abortion     Family History  Problem Relation Age of Onset  . Other Neg Hx   . Hyperthyroidism Mother   . Heart disease Mother   . Diabetes Mother   . Hyperthyroidism Maternal Aunt   . Hypothyroidism Maternal Grandmother   . Heart murmur Maternal Grandmother   . Diabetes Maternal Grandmother   . Hyperthyroidism Maternal Grandfather   . Heart disease Maternal Grandfather      Exam    Uterus:     Pelvic Exam:    Perineum: Normal Perineum   Vulva: Bartholin's, Urethra, Skene's normal   Vagina:  normal discharge       Cervix: anteverted, multiparous appearance, no lesions and closed, thick with good tone   Adnexa: normal adnexa   Bony Pelvis: average  System:     Skin: normal coloration and turgor, no rashes    Neurologic: oriented   Extremities: normal strength, tone, and  muscle mass   HEENT sclera clear, anicteric   Mouth/Teeth mucous membranes moist, pharynx normal without lesions   Neck supple   Cardiovascular: regular rate and rhythm   Respiratory:  appears well, vitals normal, no respiratory distress, acyanotic, normal RR, ear and throat exam is normal, neck free of mass or lymphadenopathy, chest clear, no wheezing, crepitations, rhonchi, normal symmetric air entry   Abdomen: soft, non-tender; bowel sounds normal; no masses,  no organomegaly     Assessment:    Pregnancy: [id number] Patient Active Problem List  Diagnosis  . TOBACCO DEPENDENCE  . Twin pregnancy, antepartum  . Previous cesarean delivery, antepartum condition or complication  . Pregnancy with history of pre-term labor  . Abnormal weight loss        Plan:     Initial labs drawn. Prenatal vitamins. Problem list reviewed and updated. Genetic Screening discussed Quad Screen: ordered.  Ultrasound discussed; fetal survey: requested.  Follow up in 1 weeks. To start 40  P Needs U/S with MFM for anatomy New OB labs Will need RCS at 36 wks.   PRATT,TANYA S 05/02/2012

## 2012-05-02 NOTE — Progress Notes (Signed)
Pulse: 100 Early 1 hr due to family history of diabetes, mother is type II Lab due at [phone]

## 2012-05-02 NOTE — Patient Instructions (Signed)
Pregnancy and Smoking Smoking during pregnancy is very unhealthy for the mother and the developing fetus. The addictive drug in cigarettes (nicotine), carbon monoxide, and many other poisons are inhaled from a cigarette and are carried through your bloodstream to your fetus. Cigarette smoke contains more than 2,500 chemicals. It is not known which of these chemicals are harmful to the developing fetus. However, both nicotine and carbon monoxide play a role in causing health problems in pregnancy. Effects on the fetus of smoking during pregnancy:  Decrease in blood flow and oxygen to the uterus, placenta, and your fetus.  Increased heart rate of the fetus.  Slowing of your fetus's growth in the uterus (intrauterine growth retardation).  Placental problems. Placenta may partially cover or completely cover the opening to the cervix (placenta previa), or the placenta may partially or completely separate from the uterus (placental abruption).  Increase risk of pregnancy outside of the uterus (tubal pregnancy).  Premature rupture membranes, causing the sac that holds the fetus to break too early, resulting in premature birth and increased health risks to the newborn.  Increased risk of birth defects, including heart defects.  Increased risk of miscarriage. Newborns born to women who smoke during pregnancy:  Are more likely to be born too early (prematurely).  Are more likely to be at a low birth weight.  Are at risk for serious health problems, chronic or lifelong disabilities (cerebral palsy, mental retardation, learning problems), and possibly even death  Are at risk of Sudden Infant Death Syndrome (SIDS).  Have higher rates of miscarriage and stillbirth.  Have more lung and breathing (respiratory) problems. Long-term effects on a child's behavior: Some of the following trends are seen with children of smoking mothers:  Increased risk for drug abuse, behavior, and conduct  disorders.  Increased risk for smoking in adolescent girls.  Increased risk for negative behavior in 2-year-olds.  Increase risk for asthma, colic, and childhood obesity, which can lead to diabetes.  Increased risk for finger and toe disorders. Resources to stop smoking during pregnancy:  Counseling.  Psychological treatment.  Acupuncture.  Family intervention.  Hypnosis.  Medicines that are safe to take during pregnancy. Nicotine supplements have not been studied enough. They should only be considered when all other methods fail.  Telephone QUIT lines. Smoking and Breastfeeding:  Nicotine gets passed to the infant through a mother's breastmilk. This can cause nausea, colic, cramping, and diarrhea in the infant.  Smoking may reduce milk supply and interfere with the let-down response.  Even formula-fed infants of mothers who smoke have nicotine and cotinine (nicotine by-product) in their urine. Other resources to help stop smoking:  American Cancer Society: www.cancer.org  American Heart Association: www.americanheart.org  National Cancer Institute: www.cancer.gov  Smoke Free Families: www.smokefreefamilies.[address] [place]): (380)133-2674 START Document Released: 06/27/2004 Document Revised: 05/08/2011 Document Reviewed: 11/25/2008 [facility] Patient Information 2013 [place], [place].  Breastfeeding Deciding to breastfeed is one of the best choices you can make for you and your baby. The information that follows gives a brief overview of the benefits of breastfeeding as well as common topics surrounding breastfeeding. BENEFITS OF BREASTFEEDING For the baby  The first milk (colostrum) helps the baby's digestive system function better.   There are antibodies in the mother's milk that help the baby fight off infections.   The baby has a lower incidence of asthma, allergies, and sudden infant death syndrome (SIDS).   The nutrients in breast milk are better  for the baby than infant formulas,  and breast milk helps the baby's brain grow better.   Babies who breastfeed have less gas, colic, and constipation.  For the mother  Breastfeeding helps develop a very special bond between the mother and her baby.   Breastfeeding is convenient, always available at the correct temperature, and costs nothing.   Breastfeeding burns calories in the mother and helps her lose weight that was gained during pregnancy.   Breastfeeding makes the uterus contract back down to normal size faster and slows bleeding following delivery.   Breastfeeding mothers have a lower risk of developing breast cancer.  BREASTFEEDING FREQUENCY  A healthy, full-term baby may breastfeed as often as every hour or space his or her feedings to every 3 hours.   Watch your baby for signs of hunger. Nurse your baby if he or she shows signs of hunger. How often you nurse will vary from baby to baby.   Nurse as often as the baby requests, or when you feel the need to reduce the fullness of your breasts.   Awaken the baby if it has been 3 4 hours since the last feeding.   Frequent feeding will help the mother make more milk and will help prevent problems, such as sore nipples and engorgement of the breasts.  BABY'S POSITION AT THE BREAST  Whether lying down or sitting, be sure that the baby's tummy is facing your tummy.   Support the breast with 4 fingers underneath the breast and the thumb above. Make sure your fingers are well away from the nipple and baby's mouth.   Stroke the baby's lips gently with your finger or nipple.   When the baby's mouth is open wide enough, place all of your nipple and as much of the areola as possible into your baby's mouth.   Pull the baby in close so the tip of the nose and the baby's cheeks touch the breast during the feeding.  FEEDINGS AND SUCTION  The length of each feeding varies from baby to baby and from feeding to feeding.    The baby must suck about 2 3 minutes for your milk to get to him or her. This is called a "let down." For this reason, allow the baby to feed on each breast as long as he or she wants. Your baby will end the feeding when he or she has received the right balance of nutrients.   To break the suction, put your finger into the corner of the baby's mouth and slide it between his or her gums before removing your breast from his or her mouth. This will help prevent sore nipples.  HOW TO TELL WHETHER YOUR BABY IS GETTING ENOUGH BREAST MILK. Wondering whether or not your baby is getting enough milk is a common concern among mothers. You can be assured that your baby is getting enough milk if:   Your baby is actively sucking and you hear swallowing.   Your baby seems relaxed and satisfied after a feeding.   Your baby nurses at least 8 12 times in a 24 hour time period. Nurse your baby until he or she unlatches or falls asleep at the first breast (at least 10 20 minutes), then offer the second side.   Your baby is wetting 5 6 disposable diapers (6 8 cloth diapers) in a 24 hour period by 61 69 days of age.   Your baby is having at least 3 4 stools every 24 hours for the first 6 weeks. The stool should  be soft and yellow.   Your baby should gain 4 7 ounces per week after he or she is 40 days old.   Your breasts feel softer after nursing.  REDUCING BREAST ENGORGEMENT  In the first week after your baby is born, you may experience signs of breast engorgement. When breasts are engorged, they feel heavy, warm, full, and may be tender to the touch. You can reduce engorgement if you:   Nurse frequently, every 2 3 hours. Mothers who breastfeed early and often have fewer problems with engorgement.   Place light ice packs on your breasts for 10 20 minutes between feedings. This reduces swelling. Wrap the ice packs in a lightweight towel to protect your skin. Bags of frozen vegetables work well for  this purpose.   Take a warm shower or apply warm, moist heat to your breast for 5 10 minutes just before each feeding. This increases circulation and helps the milk flow.   Gently massage your breast before and during the feeding. Using your finger tips, massage from the chest wall towards your nipple in a circular motion.   Make sure that the baby empties at least one breast at every feeding before switching sides.   Use a breast pump to empty the breasts if your baby is sleepy or not nursing well. You may also want to pump if you are returning to work oryou feel you are getting engorged.   Avoid bottle feeds, pacifiers, or supplemental feedings of water or juice in place of breastfeeding. Breast milk is all the food your baby needs. It is not necessary for your baby to have water or formula. In fact, to help your breasts make more milk, it is best not to give your baby supplemental feedings during the early weeks.   Be sure the baby is latched on and positioned properly while breastfeeding.   Wear a supportive bra, avoiding underwire styles.   Eat a balanced diet with enough fluids.   Rest often, relax, and take your prenatal vitamins to prevent fatigue, stress, and anemia.  If you follow these suggestions, your engorgement should improve in 24 48 hours. If you are still experiencing difficulty, call your lactation consultant or caregiver.  CARING FOR YOURSELF Take care of your breasts  Bathe or shower daily.   Avoid using soap on your nipples.   Start feedings on your left breast at one feeding and on your right breast at the next feeding.   You will notice an increase in your milk supply 2 5 days after delivery. You may feel some discomfort from engorgement, which makes your breasts very firm and often tender. Engorgement "peaks" out within 24 48 hours. In the meantime, apply warm moist towels to your breasts for 5 10 minutes before feeding. Gentle massage and  expression of some milk before feeding will soften your breasts, making it easier for your baby to latch on.   Wear a well-fitting nursing bra, and air dry your nipples for a 3 after each feeding.   Only use cotton bra pads.   Only use pure lanolin on your nipples after nursing. You do not need to wash it off before feeding the baby again. Another option is to express a few drops of breast milk and gently massage it into your nipples.  Take care of yourself  Eat well-balanced meals and nutritious snacks.   Drinking milk, fruit juice, and water to satisfy your thirst (about 8 glasses a day).   Get  plenty of rest.  Avoid foods that you notice affect the baby in a bad way.  SEEK MEDICAL CARE IF:   You have difficulty with breastfeeding and need help.   You have a hard, red, sore area on your breast that is accompanied by a fever.   Your baby is too sleepy to eat well or is having trouble sleeping.   Your baby is wetting less than 6 diapers a day, by 71 days of age.   Your baby's skin or white part of his or her eyes is more yellow than it was in the hospital.   You feel depressed.  Document Released: 02/13/2005 Document Revised: 08/15/2011 Document Reviewed: 05/14/2011 [facility] Patient Information 2013 [place], [place].

## 2012-05-02 NOTE — Progress Notes (Signed)
Nutrition note: 1st visit consult Pt is having twins. Pt has gained 10.3# @ [redacted]w[redacted]d, which is wnl. Pt reports eating 3 meals & 2-3 snacks/d. Pt reports no N&V or heartburn & NKFA. Pt is taking a PNV and a multivitamin. Pt reports she is not walking or doing any physical activity. Pt received verbal & written education on nutrition during pregnancy with twins. Encouraged pt to consume a protein source with all her meals & snacks. Disc wt gain goals of 37-54# or 1.4#/wk. Pt agrees to continue taking PNV & multivitamin. Pt has WIC & plans to BF. F/u in 4-6wks Blondell Reveal, MS, RD, LDN

## 2012-05-03 LAB — GLUCOSE TOLERANCE, 1 HOUR (50G) W/O FASTING: Glucose, 1 Hour GTT: 106 mg/dL (ref 70–140)

## 2012-05-03 LAB — ANTIBODY SCREEN: Antibody Screen: NEGATIVE

## 2012-05-05 ENCOUNTER — Inpatient Hospital Stay (HOSPITAL_COMMUNITY)
Admission: AD | Admit: 2012-05-05 | Discharge: 2012-05-05 | Disposition: A | Discharge status: Home / Self Care | Payer: Self-pay | Source: Ambulatory Visit | Attending: Obstetrics & Gynecology | Admitting: Obstetrics & Gynecology

## 2012-05-05 ENCOUNTER — Encounter (HOSPITAL_COMMUNITY): Payer: Self-pay | Admitting: *Deleted

## 2012-05-05 DIAGNOSIS — W19XXXA Unspecified fall, initial encounter: Secondary | ICD-10-CM

## 2012-05-05 DIAGNOSIS — Y92009 Unspecified place in unspecified non-institutional (private) residence as the place of occurrence of the external cause: Secondary | ICD-10-CM | POA: Insufficient documentation

## 2012-05-05 DIAGNOSIS — Y9301 Activity, walking, marching and hiking: Secondary | ICD-10-CM | POA: Insufficient documentation

## 2012-05-05 DIAGNOSIS — W010XXA Fall on same level from slipping, tripping and stumbling without subsequent striking against object, initial encounter: Secondary | ICD-10-CM | POA: Insufficient documentation

## 2012-05-05 DIAGNOSIS — O99891 Other specified diseases and conditions complicating pregnancy: Secondary | ICD-10-CM | POA: Insufficient documentation

## 2012-05-05 DIAGNOSIS — R1032 Left lower quadrant pain: Secondary | ICD-10-CM | POA: Insufficient documentation

## 2012-05-05 NOTE — MAU Provider Note (Signed)
History     CSN: [id number]  Arrival date and time: 05/05/12 1250   First Provider Initiated Contact with Patient 05/05/12 1349      Chief Complaint  Patient presents with  . Fall   HPI [name] is a 27 y.o. [id number] at [redacted]w[redacted]d who presents to MAU today with complaint of left side pain after a fall yesterday. The patient states that she tripped over a branch in her yard and fell on her left side. She was able to catch herself with her arm but still hit the left side of her abdomen on concrete. She is having moderate pain to the left flank and LLQ. She denies vaginal bleeding or change in vaginal discharge. She had felt occasional fetal movements prior to the fall and states that she hasn't felt much today. She also states that she has not been counting the movements today.    OB History   Grav Para Term Preterm Abortions TAB SAB Ect Mult Living   7 4 1 3 2 1 1  0 0 3      Past Medical History  Diagnosis Date  . Urinary tract infection   . Meningitis   . Ovarian cyst   . Abnormal Pap smear     x4, repeats ok    Past Surgical History  Procedure Laterality Date  . Cesarean section    . Therapeutic abortion      Family History  Problem Relation Age of Onset  . Other Neg Hx   . Hyperthyroidism Mother   . Heart disease Mother   . Diabetes Mother   . Hyperthyroidism Maternal Aunt   . Hypothyroidism Maternal Grandmother   . Heart murmur Maternal Grandmother   . Diabetes Maternal Grandmother   . Hyperthyroidism Maternal Grandfather   . Heart disease Maternal Grandfather     History  Substance Use Topics  . Smoking status: Current Every Day Smoker -- 0.25 packs/day for 8 years    Types: Cigarettes  . Smokeless tobacco: Never Used  . Alcohol Use: No     Comment: social    Allergies: No Known Allergies  Prescriptions prior to admission  Medication Sig Dispense Refill  . Prenatal Vit-Fe Fumarate-FA (PRENATAL MULTIVITAMIN) TABS Take 1 tablet by mouth at  bedtime.      . hydroxyprogesterone caproate (DELALUTIN) 250 mg/mL OIL Inject 1 mL (250 mg total) into the muscle once.  0.98 mL  5    Review of Systems  Constitutional: Negative for fever and malaise/fatigue.  Gastrointestinal: Positive for abdominal pain.  Genitourinary:       Neg - vaginal bleeding   Physical Exam   Blood pressure 109/67, pulse 84, temperature 98.3 F (36.8 C), temperature source Oral, resp. rate 18, last menstrual period 12/12/2011.  Physical Exam  Constitutional: She is oriented to person, place, and time. She appears well-developed and well-nourished. No distress.  HENT:  Head: Normocephalic and atraumatic.  Cardiovascular: Normal rate, regular rhythm and normal heart sounds.   Respiratory: Effort normal and breath sounds normal. No respiratory distress.  GI: Soft. Bowel sounds are normal. She exhibits no distension and no mass. There is tenderness (mild to moderate tenderness to palpation of the left flank and LLQ). There is no rebound and no guarding.  Neurological: She is alert and oriented to person, place, and time.  Skin: Skin is warm and dry. No erythema.  Psychiatric: She has a normal mood and affect.    MAU Course  Procedures None  MDM + FHTs for both fetuses today. Patient denies vaginal bleeding. Patient refuses pain medication stating, "I don't take medicines."   Assessment and Plan  A: Fall  P: Discharge home Tylenol as needed for discomfort if she decides to take medicine, ice to the affected area x 15 minutes 4-5 x/day for the next 2-3 days PRN Bleeding precautions discussed Keep follow-up as scheduled in [facility] clinic and [facility] Patient may return to MAU as needed of if her condition were to change or worsen  Freddi Starr, PA-C  05/05/2012, 1:50 PM

## 2012-05-05 NOTE — MAU Note (Signed)
Pt states she was walking in there yard and stepped over a limb and fell and hit her left.

## 2012-05-07 LAB — HEMOGLOBINOPATHY EVALUATION
Hemoglobin Other: 0 %
Hgb S Quant: 0 %

## 2012-05-07 LAB — T3, FREE: T3, Free: 2.7 pg/mL (ref 2.3–4.2)

## 2012-05-07 LAB — T4, FREE: Free T4: 1 ng/dL (ref 0.80–1.80)

## 2012-05-08 ENCOUNTER — Encounter: Payer: Self-pay | Admitting: Family Medicine

## 2012-05-08 DIAGNOSIS — E0590 Thyrotoxicosis, unspecified without thyrotoxic crisis or storm: Secondary | ICD-10-CM | POA: Insufficient documentation

## 2012-05-08 LAB — RUBELLA ANTIBODY, IGM: Rubella IgM: 0.49 (ref <0.90)

## 2012-05-09 ENCOUNTER — Encounter: Payer: Self-pay | Admitting: *Deleted

## 2012-05-09 ENCOUNTER — Encounter: Payer: Self-pay | Admitting: Family Medicine

## 2012-05-09 DIAGNOSIS — O9989 Other specified diseases and conditions complicating pregnancy, childbirth and the puerperium: Secondary | ICD-10-CM

## 2012-05-09 DIAGNOSIS — Z283 Underimmunization status: Secondary | ICD-10-CM

## 2012-05-09 DIAGNOSIS — Z2839 Other underimmunization status: Secondary | ICD-10-CM | POA: Insufficient documentation

## 2012-05-14 ENCOUNTER — Encounter (HOSPITAL_COMMUNITY): Payer: Self-pay | Admitting: Family Medicine

## 2012-05-22 ENCOUNTER — Ambulatory Visit (HOSPITAL_COMMUNITY)
Admission: RE | Admit: 2012-05-22 | Discharge: 2012-05-22 | Disposition: A | Discharge status: Home / Self Care | Payer: Self-pay | Source: Ambulatory Visit | Attending: Family Medicine | Admitting: Family Medicine

## 2012-05-22 ENCOUNTER — Other Ambulatory Visit: Payer: Self-pay

## 2012-05-22 ENCOUNTER — Ambulatory Visit (HOSPITAL_COMMUNITY)
Admission: RE | Admit: 2012-05-22 | Discharge: 2012-05-22 | Disposition: A | Discharge status: Home / Self Care | Payer: Self-pay | Source: Ambulatory Visit

## 2012-05-22 ENCOUNTER — Other Ambulatory Visit: Payer: Self-pay | Admitting: Family Medicine

## 2012-05-22 ENCOUNTER — Encounter (HOSPITAL_COMMUNITY): Payer: Self-pay

## 2012-05-22 DIAGNOSIS — O09299 Supervision of pregnancy with other poor reproductive or obstetric history, unspecified trimester: Secondary | ICD-10-CM | POA: Insufficient documentation

## 2012-05-22 DIAGNOSIS — O30009 Twin pregnancy, unspecified number of placenta and unspecified number of amniotic sacs, unspecified trimester: Secondary | ICD-10-CM

## 2012-05-22 DIAGNOSIS — Z8751 Personal history of pre-term labor: Secondary | ICD-10-CM | POA: Insufficient documentation

## 2012-05-22 DIAGNOSIS — Z363 Encounter for antenatal screening for malformations: Secondary | ICD-10-CM | POA: Insufficient documentation

## 2012-05-22 DIAGNOSIS — Z2839 Encounter for supervision of normal pregnancy, unspecified, unspecified trimester: Secondary | ICD-10-CM

## 2012-05-22 DIAGNOSIS — Z1389 Encounter for screening for other disorder: Secondary | ICD-10-CM | POA: Insufficient documentation

## 2012-05-22 DIAGNOSIS — Z283 Underimmunization status: Secondary | ICD-10-CM

## 2012-05-22 DIAGNOSIS — O358XX0 Maternal care for other (suspected) fetal abnormality and damage, not applicable or unspecified: Secondary | ICD-10-CM | POA: Insufficient documentation

## 2012-05-22 DIAGNOSIS — O09212 Supervision of pregnancy with history of pre-term labor, second trimester: Secondary | ICD-10-CM

## 2012-05-22 DIAGNOSIS — O34219 Maternal care for unspecified type scar from previous cesarean delivery: Secondary | ICD-10-CM | POA: Insufficient documentation

## 2012-05-22 NOTE — Consult Note (Signed)
MFM consult  27 yr old [id number] at [redacted]w[redacted]d with dichorionic/diamniotic twin gestation with history of preterm delivery referred by Dr. Shawnie Pons for fetal anatomic survey and consult.   OB History   Grav Para Term Preterm Abortions TAB SAB Ect Mult Living   7 4 1 3 2 1 1  0 0 3       Ultrasound today shows: Dichorionic/diamniotic twin gestation; the dividing membrane is seen. Fetal biometry for both fetuses is consistent with dating. The fetuses are concordant. Both placentas are anterior without evidence of previa. Normal amniotic fluid volume for both fetuses. Normal transvaginal cervical length. Twin A: views of the nuchal fold, palate, orbits, diaphragm, spine, heart, and ankles are limited. The remainder of the limited anatomy survey for twin A is normal. Twin B: the views of the cavum, palate, nose/lips, diaphragm, heart, kidneys, and ankles are limited. Twin B has ventriculomegaly; the lateral ventricles measure 10.65mm. The remainder of the limited anatomy survey for twin B is normal.    I counseled the patient as follows:   1. Twin pregnancy:  - discussed increased risk of preterm labor/PPROM/preterm cervical dilation and preterm delivery- average age of delivery is 47 weeks with dichorionic twins  - would recommend given her increased risk of preterm delivery to perform cervical length surveillance every 2 weeks until 68 or [redacted] weeks gestation  - discussed increased risk of fetal growth restriction- will follow fetal growth every 4 weeks  - discussed increased risk of maternal complications including gestational diabetes and preeclampsia as well as need for C section (although with history of classical C section patient will need to delivery via C section)  2. Previous preterm deliveries:  Patient delivered her first around 6 months- had no prenatal care; went in to labor and started bleeding and delivered at home Had 2 other preterm deliveries at 70 and [redacted] weeks gestation; presented with  preterm labor I discussed that with a history of preterm delivery the risk is increased in future pregnancies. Studies have shown that the frequency of recurrent preterm birth was 14 to 22 % after one preterm delivery, 28 to 42 % after two preterm deliveries, and 67% after three preterm deliveries. Term births decreased the risk of preterm birth in subsequent pregnancies.  I explained that I would recommend weekly injections of 17 OH progesterone as use from 16-36 weeks has been shown to decrease the risk of recurrent preterm birth by up to 40%. I discussed we do not know of any adverse effects of progesterone however long term data is limited. Patient is in agree ment and is starting progesterone injections this week. Discussed data with prior preterm birth and current twin pregnancy is more sparse and the level of benefit is not known; however given the potential for benefit would still recommend 17OH progesterone.  I discussed that her cervical length is normal on today's ultrasound. I recommend close surveillance for development of signs/symptoms of preterm labor/PPROM. Recommend follow up ultrasound in 2 weeks for cervical length. I would recommend serial cervical lengths until at least [redacted] weeks gestation.  3. Prior classical C section: patient will be delivered by repeat C section at [redacted] weeks gestation. Discussed risk of uterine scar dehisence  4. Ventriculomegaly in twin B:  - discussed possible association with fetal aneuploidy- risk is approximately 4-14%- patient declined amniocentesis; had normal quad screen; I discussed option of cell free fetal DNA screening and its limitations in detecting fetal aneuploidy- patient opted for this screening which was drawn  today  - also discussed possible association with congenital infections- given mild ventriculomegaly recommend reevaluate in 2 weeks and if persists recommend obtain serology for toxoplasmosis, CMV, and parvovirus IgG and IgM  - discussed  association with neural tube defects: the spine and posterior fossa appear normal on today's exam  - discussed association with narrowing of ducts in the ventricular system (obstructive hydrocephalus)  - discussed association with adverse neurodevelopmental outcomes including neurologic, motor, and cognitive impairment  I discussed risk of adverse outcomes is correlated to level of ventriculomegaly. I discussed that at this time the ventriculomegaly is mild and if the ventricles remain less than 12 mm the prognosis if favorable with a greater than 90% survival rate and greater than 80% chance of normal neurodevelopmental outcomes. I explained even with mild ventriculomegaly there is still an approximately 10% chance of mild delays and 10% chance of moderate to severe delays.  I explained with progressing ventriculomegaly the neonate may require a ventriculo peritoneal shunt which have risks of infection and increase risk of adverse neurodevelopmental outcomes.   I spent 40 minutes in face to face consultation with the patient in addition to time spent on the ultrasound.  Eulis Foster, MD

## 2012-05-22 NOTE — Progress Notes (Signed)
[facility] ultrasound  Indication: 27 yr old [id number] at [redacted]w[redacted]d with dichorionic/diamniotic twin gestation with history of preterm delivery for fetal anatomic survey.  Fidings: 1. Dichorionic/diamniotic twin gestation; the dividing membrane is seen. 2. Fetal biometry for both fetuses is consistent with dating. The fetuses are concordant. 3. Both placentas are anterior without evidence of previa. 4. Normal amniotic fluid volume for both fetuses. 5. Normal transvaginal cervical length. 6. Twin A: views of the nuchal fold, palate, orbits, diaphragm, spine, heart, and ankles are limited. 7. The remainder of the limited anatomy survey for twin A is normal. 8. Twin B: the views of the cavum, palate, nose/lips, diaphragm, heart, kidneys, and ankles are limited. 9. Twin B has ventriculomegaly; the lateral ventricles measure 10.73mm. 10. The remainder of the limited anatomy survey for twin B is normal.  Recommendations: 1. Twin pregnancy: - discussed increased risk of preterm labor/PPROM/preterm cervical dilation and preterm delivery- average age of delivery is 66 weeks with dichorionic twins - would recommend given her increased risk of preterm delivery to perform cervical length surveillance every 2 weeks until 68 or [redacted] weeks gestation - discussed increased risk of fetal growth restriction- will follow fetal growth every 4 weeks - discussed increased risk of maternal complications including gestational diabetes and preeclampsia as well as need for C section (although with history of classical C section patient will need to delivery via C section) 2. Previous preterm deliveries: I discussed that with a history of preterm delivery the risk is increased in future pregnancies. Studies have shown that the frequency of recurrent preterm birth was 14 to 22 % after one preterm delivery, 28 to 42 % after two preterm deliveries, and 67% after three preterm deliveries. Term births decreased the  risk of preterm birth in subsequent pregnancies. I explained that I would recommend weekly injections of 17 OH progesterone as use from 16-36 weeks has been shown to decrease the risk of recurrent preterm birth by up to 40%. I discussed we do not know of any adverse effects of progesterone however long term data is limited. Patient is in agree ment and is starting progesterone injections this week. Discussed data with prior preterm birth and current twin pregnancy is more sparse and the level of benefit is not known; however given the potential for benefit would still recommend 17OH progesterone.  I discussed that her cervical length is normal on today's ultrasound. I recommend close surveillance for development of signs/symptoms of preterm labor/PPROM. Recommend follow up ultrasound in 2 weeks for cervical length. I would recommend serial cervical lengths until at least [redacted] weeks gestation.  3. Prior classical C section: patient will be delivered by repeat C section at [redacted] weeks gestation. Discussed risk of uterine scar dehisence 4. Ventriculomegaly in twin B: - discussed possible association with fetal aneuploidy- risk is approximately 4-14%- patient declined amniocentesis; had normal quad screen; I discussed option of cell free fetal DNA screening and its limitations in detecting fetal aneuploidy- patient opted for this screening which was drawn today - also discussed possible association with congenital infections- given mild ventriculomegaly recommend reevaluate in 2 weeks and if persists recommend obtain serology for toxoplasmosis, CMV, and parvovirus IgG and IgM - discussed association with neural tube defects: the spine and posterior fossa appear normal on today's exam - discussed association with narrowing of ducts in the ventricular system (obstructive hydrocephalus) - discussed association with adverse neurodevelopmental outcomes including neurologic, motor, and cognitive impairment I discussed  risk of adverse outcomes is  correlated to level of ventriculomegaly. I discussed that at this time the ventriculomegaly is mild and if the ventricles remain less than 12 mm the prognosis if favorable with a greater than 90% survival rate and greater than 80% chance of normal neurodevelopmental outcomes. I explained even with mild ventriculomegaly there is still an approximately 10% chance of mild delays and 10% chance of moderate to severe delays. I explained with progressing ventriculomegaly the neonate may require a ventriculo peritoneal shunt which have risks of infection and increase risk of adverse neurodevelopmental outcomes.  Eulis Foster, MD

## 2012-05-23 ENCOUNTER — Ambulatory Visit (INDEPENDENT_AMBULATORY_CARE_PROVIDER_SITE_OTHER): Payer: Self-pay | Admitting: Obstetrics & Gynecology

## 2012-05-23 ENCOUNTER — Encounter: Payer: Self-pay | Admitting: Family Medicine

## 2012-05-23 ENCOUNTER — Other Ambulatory Visit: Payer: Self-pay | Admitting: Obstetrics & Gynecology

## 2012-05-23 VITALS — BP 111/68 | Temp 97.2°F | Wt 148.6 lb

## 2012-05-23 DIAGNOSIS — O30009 Twin pregnancy, unspecified number of placenta and unspecified number of amniotic sacs, unspecified trimester: Secondary | ICD-10-CM

## 2012-05-23 DIAGNOSIS — O359XX0 Maternal care for (suspected) fetal abnormality and damage, unspecified, not applicable or unspecified: Secondary | ICD-10-CM | POA: Insufficient documentation

## 2012-05-23 DIAGNOSIS — O09212 Supervision of pregnancy with history of pre-term labor, second trimester: Secondary | ICD-10-CM

## 2012-05-23 DIAGNOSIS — O09219 Supervision of pregnancy with history of pre-term labor, unspecified trimester: Secondary | ICD-10-CM

## 2012-05-23 LAB — POCT URINALYSIS DIP (DEVICE)
Glucose, UA: NEGATIVE mg/dL
Hgb urine dipstick: NEGATIVE
Nitrite: NEGATIVE
Specific Gravity, Urine: >=1.03 (ref 1.005–1.030)
Urobilinogen, UA: 0.2 mg/dL (ref 0.0–1.0)

## 2012-05-23 MED ORDER — HYDROXYPROGESTERONE CAPROATE 250 MG/ML IM OIL
250.0000 mg | TOPICAL_OIL | INTRAMUSCULAR | Status: DC
  Administered 2012-05-23 – 2012-07-12 (×6): 250 mg via INTRAMUSCULAR

## 2012-05-23 NOTE — Progress Notes (Signed)
Patient reports some cramping this morning. Pulse= 91

## 2012-05-23 NOTE — Patient Instructions (Signed)
Multiple Pregnancy A multiple pregnancy is when a woman is pregnant with two or more fetuses. Multiple pregnancies occur in about 3% of all births. The more babies in a pregnancy, the greater the risks of problems to the babies and mother. This includes death. Since the use of Assisted Reproductive Technology (ART) and medications that can induce ovulation, multiple fetal gestation has increased.  RISKS TO THE MOTHER  Preeclampsia and eclampsia.  Postpartum bleeding (hemorrhage).  Kidney infection (pyelonephritis).  Develop anemia.  Develop diabetes.  Liver complications.  A blood clot blocks the artery, or branch of the artery leading to the lungs (pulmonary embolism).  Blood clots in the leg.  Placental separation.  Higher rate of Cesarean Section deliveries.  Women over 35 years old have a higher rate of Downs Syndrome babies. RISKS TO THE BABY  Preterm labor with a premature baby.  Very low birth weight babies that are less than 3 pounds, especially with triplets or mores.  Premature rupture of the membranes.  Twin to twin blood transfusion with one baby anemic and the other baby with too much blood in its system. There may also be heart failure.  With triplets or more, one of the babies is at high risk for cerebral palsy or other neurologic problem.  There is a higher incidence of fetal death. CARE OF MOTHERS WITH MULTIPLE FETAL GESTATION Multiple pregnancies need more care and special prenatal care.  You will see your caregiver more often.  You will have more tests including ultrasounds, nonstress tests and blood tests.  You will have special tests done called amniocentesis and a biophysical profile.  You may be hospitalized more often during the pregnancy.  You will be encouraged to eat a balanced and healthy diet with vitamin and mineral supplements as directed.  You will be asked to get more rest and sleep to keep up your energy.  You will be asked to  restrict your daily activities, exercise, work, household chores and sexual activity.  If you have preterm labor with small babies, you will be given a steroid injection to help the babies lungs mature and do better when born.  The delivery may have to be by Cesarean delivery, especially if there are triplets or more.  The delivery should be in a hospital with an intensive care nursery and Neonatologists (pediatrician for high risk babies) to care for the newborn babies. HOME CARE INSTRUCTIONS   Follow the caregiver's recommendations regarding office visits, tests for you and the babies, diet, rest and medications.  Avoid a large amount of physical activity.  Arrange to have help after the babies are born and when you go home from the hospital.  Take classes on how to care for multiple babies before you deliver them. SEEK IMMEDIATE MEDICAL CARE IF:   You develop a temperature of 102 F (38.9 C) or higher.  You are leaking fluid from the vagina.  You develop vaginal bleeding.  You develop uterine contractions.  You develop a severe headache, severe upper abdominal pain, visual problems or excessive swelling of your face, hands and feet.  You develop severe back pain or leg pain.  You develop severe tiredness.  You develop chest pain.  You have shortness of breath, fall down or pass out. Document Released: 11/23/2007 Document Revised: 05/08/2011 Document Reviewed: 11/23/2007 Exit[employer] Patient Information 2013 [employer].  

## 2012-05-23 NOTE — Progress Notes (Signed)
Pt left prior to seeing Social Work.

## 2012-05-23 NOTE — Progress Notes (Signed)
Normal [place] 3/26, scheduled for f/u in 2 weeks for cervical eval. Poor appetite no nausea

## 2012-05-30 ENCOUNTER — Ambulatory Visit (INDEPENDENT_AMBULATORY_CARE_PROVIDER_SITE_OTHER): Payer: Medicaid Other | Admitting: General Practice

## 2012-05-30 VITALS — BP 100/64 | HR 85 | Temp 97.9°F | Ht 69.0 in | Wt 152.4 lb

## 2012-05-30 DIAGNOSIS — O09212 Supervision of pregnancy with history of pre-term labor, second trimester: Secondary | ICD-10-CM

## 2012-05-30 DIAGNOSIS — O09219 Supervision of pregnancy with history of pre-term labor, unspecified trimester: Secondary | ICD-10-CM

## 2012-05-31 ENCOUNTER — Telehealth (HOSPITAL_COMMUNITY): Payer: Self-pay | Admitting: MS"

## 2012-05-31 NOTE — Telephone Encounter (Signed)
Called Laurie Hall to discuss her [facility], cell free fetal DNA testing. Patient was identified by name and DOB. Testing was offered because of previous ultrasound finding of ventriculomegaly for twin B. We reviewed that these are within normal limits, showing a less than 1 in 10,000 risk for trisomies 21, 18 and 13. This testing identifies > 99% of pregnancies with trisomy 21, >98% of pregnancies with trisomy 75, and >80% with trisomy 60; the false positive rate is <0.1% for all conditions for a singleton pregnancy. Detection rates are decreased in a twin gestation. She understands that this testing does not identify all genetic conditions and does not assess for all etiologies of ventriculomegaly. All questions were answered to her satisfaction, she was encouraged to call with additional questions or concerns.  Quinn Plowman, MS Certified Genetic Counselor 06/03/2012 12:09 PM     Left message with female who answered patient's phone for her to call back when she has a chance. Patient was not there at the time of phone call.   Clydie Braun Orrin Yurkovich 05/31/2012 9:33 AM

## 2012-06-03 ENCOUNTER — Telehealth: Payer: Self-pay | Admitting: *Deleted

## 2012-06-03 NOTE — Telephone Encounter (Signed)
Patient left a message she had some tests done 2/5 weeks ago - wants results. Called and left a message we are returning your call- please call clinic again.

## 2012-06-03 NOTE — Telephone Encounter (Signed)
Patient called back and states she has gotten the results and doesn't need a  Call back.

## 2012-06-05 ENCOUNTER — Ambulatory Visit (HOSPITAL_COMMUNITY)
Admission: RE | Admit: 2012-06-05 | Discharge: 2012-06-05 | Disposition: A | Discharge status: Home / Self Care | Payer: Medicaid Other | Source: Ambulatory Visit | Attending: Obstetrics & Gynecology | Admitting: Obstetrics & Gynecology

## 2012-06-05 ENCOUNTER — Ambulatory Visit (INDEPENDENT_AMBULATORY_CARE_PROVIDER_SITE_OTHER): Payer: Medicaid Other | Admitting: *Deleted

## 2012-06-05 VITALS — BP 102/59 | HR 97 | Wt 153.0 lb

## 2012-06-05 VITALS — BP 104/63 | HR 92 | Temp 98.7°F | Wt 152.3 lb

## 2012-06-05 DIAGNOSIS — O34219 Maternal care for unspecified type scar from previous cesarean delivery: Secondary | ICD-10-CM | POA: Insufficient documentation

## 2012-06-05 DIAGNOSIS — Z8751 Personal history of pre-term labor: Secondary | ICD-10-CM | POA: Insufficient documentation

## 2012-06-05 DIAGNOSIS — O30009 Twin pregnancy, unspecified number of placenta and unspecified number of amniotic sacs, unspecified trimester: Secondary | ICD-10-CM | POA: Insufficient documentation

## 2012-06-05 DIAGNOSIS — O09219 Supervision of pregnancy with history of pre-term labor, unspecified trimester: Secondary | ICD-10-CM

## 2012-06-05 DIAGNOSIS — O358XX0 Maternal care for other (suspected) fetal abnormality and damage, not applicable or unspecified: Secondary | ICD-10-CM | POA: Insufficient documentation

## 2012-06-05 DIAGNOSIS — O359XX2 Maternal care for (suspected) fetal abnormality and damage, unspecified, fetus 2: Secondary | ICD-10-CM

## 2012-06-05 DIAGNOSIS — Z1389 Encounter for screening for other disorder: Secondary | ICD-10-CM | POA: Insufficient documentation

## 2012-06-05 DIAGNOSIS — Z363 Encounter for antenatal screening for malformations: Secondary | ICD-10-CM | POA: Insufficient documentation

## 2012-06-05 DIAGNOSIS — O09212 Supervision of pregnancy with history of pre-term labor, second trimester: Secondary | ICD-10-CM

## 2012-06-05 NOTE — Progress Notes (Signed)
Laurie Hall  was seen today for an ultrasound appointment.  See full report in [facility].  Impression: DC/DA twins with best dates of 20 0/7 weeks NIPT ([facility])- low risk for fetal aneuploidy Limited ultrasound performed - mild cerebral ventriculomegaly again noted in Twin B (10.5 mm) Cranial anatomy was otherwise within normal limits Somewhat limited views of the fetal hearts obtained in both fetuses  TVUS - cervical length of 3.3 cm noted without funneling or dynamic changes  Recommendations: Recommend follow-up ultrasound examination for growth/ reevaluation of fetal anatomy in 2 weeks Continue cervical length surveillance every other week due to history of preterm delivery Blood work sent today for viral serologies due to mild ventriculomegaly  Alpha Gula, MD

## 2012-06-05 NOTE — Progress Notes (Signed)
Patient here to get 17P one day early because already here for Ultrasound in radiology.  Will resume schedule next week

## 2012-06-06 ENCOUNTER — Ambulatory Visit: Payer: Self-pay

## 2012-06-06 LAB — TOXOPLASMA ANTIBODIES- IGG AND  IGM: Toxoplasma IgG Ratio: <3 IU/mL (ref <6.0)

## 2012-06-07 LAB — CMV IGM: CMV IgM: <8 AU/mL (ref <30.00)

## 2012-06-07 LAB — CMV ANTIBODY, IGG (EIA): CMV Ab - IgG: 4.4 U/mL — ABNORMAL HIGH (ref <0.60)

## 2012-06-07 LAB — PARVOVIRUS B19 ANTIBODY, IGG AND IGM: Parovirus B19 IgM Abs: 0.1 index (ref <0.9)

## 2012-06-10 ENCOUNTER — Telehealth (HOSPITAL_COMMUNITY): Payer: Self-pay | Admitting: *Deleted

## 2012-06-10 NOTE — Telephone Encounter (Signed)
Called [place] with her lab results from toxo, cmv and parvo titers.  Reviewed with Dr. Vincenza Hews and found to be normal.  Pt verbalized understanding.  She has a follow up appointment on 4/24 at 830am.

## 2012-06-13 ENCOUNTER — Ambulatory Visit (INDEPENDENT_AMBULATORY_CARE_PROVIDER_SITE_OTHER): Payer: Medicaid Other | Admitting: *Deleted

## 2012-06-13 VITALS — BP 118/60 | HR 78 | Wt 159.9 lb

## 2012-06-13 DIAGNOSIS — O09219 Supervision of pregnancy with history of pre-term labor, unspecified trimester: Secondary | ICD-10-CM

## 2012-06-20 ENCOUNTER — Ambulatory Visit (HOSPITAL_COMMUNITY)
Admission: RE | Admit: 2012-06-20 | Discharge: 2012-06-20 | Disposition: A | Discharge status: Home / Self Care | Payer: Medicaid Other | Source: Ambulatory Visit | Attending: Obstetrics & Gynecology | Admitting: Obstetrics & Gynecology

## 2012-06-20 ENCOUNTER — Other Ambulatory Visit: Payer: Self-pay | Admitting: Family Medicine

## 2012-06-20 ENCOUNTER — Ambulatory Visit (INDEPENDENT_AMBULATORY_CARE_PROVIDER_SITE_OTHER): Payer: Medicaid Other | Admitting: Family Medicine

## 2012-06-20 VITALS — BP 121/71 | Temp 97.3°F | Wt 160.6 lb

## 2012-06-20 VITALS — BP 116/70 | HR 87 | Wt 162.0 lb

## 2012-06-20 DIAGNOSIS — Z8751 Personal history of pre-term labor: Secondary | ICD-10-CM | POA: Insufficient documentation

## 2012-06-20 DIAGNOSIS — O359XX2 Maternal care for (suspected) fetal abnormality and damage, unspecified, fetus 2: Secondary | ICD-10-CM

## 2012-06-20 DIAGNOSIS — O09219 Supervision of pregnancy with history of pre-term labor, unspecified trimester: Secondary | ICD-10-CM

## 2012-06-20 DIAGNOSIS — O30009 Twin pregnancy, unspecified number of placenta and unspecified number of amniotic sacs, unspecified trimester: Secondary | ICD-10-CM

## 2012-06-20 DIAGNOSIS — O358XX0 Maternal care for other (suspected) fetal abnormality and damage, not applicable or unspecified: Secondary | ICD-10-CM

## 2012-06-20 DIAGNOSIS — F17200 Nicotine dependence, unspecified, uncomplicated: Secondary | ICD-10-CM

## 2012-06-20 DIAGNOSIS — O09299 Supervision of pregnancy with other poor reproductive or obstetric history, unspecified trimester: Secondary | ICD-10-CM | POA: Insufficient documentation

## 2012-06-20 DIAGNOSIS — O34219 Maternal care for unspecified type scar from previous cesarean delivery: Secondary | ICD-10-CM | POA: Insufficient documentation

## 2012-06-20 LAB — POCT URINALYSIS DIP (DEVICE)
Ketones, ur: NEGATIVE mg/dL
Protein, ur: 30 mg/dL — AB
Specific Gravity, Urine: >=1.03 (ref 1.005–1.030)
Urobilinogen, UA: 0.2 mg/dL (ref 0.0–1.0)
pH: 6 (ref 5.0–8.0)

## 2012-06-20 MED ORDER — HYDROXYPROGESTERONE CAPROATE 250 MG/ML IM OIL
250.0000 mg | TOPICAL_OIL | Freq: Once | INTRAMUSCULAR | Status: AC
  Administered 2012-06-20: 250 mg via INTRAMUSCULAR

## 2012-06-20 NOTE — Progress Notes (Signed)
Pulse- 87 

## 2012-06-20 NOTE — Progress Notes (Signed)
Laurie Hall  was seen today for an ultrasound appointment.  See full report in [facility].  Impression: DC/DA twin gestation with best dates of 22 3/7 weeks Interval growth is appropriate and concordant NIPS (cell free fetal DNA) - low risk for aneuploidy; viral serologies - no evidence of acute infection  Twin A: Transverse, female, anterior placenta Normal interval anatomy Growth is appropriate (44th %tile) Normal amniotic fluid volume  Twin B: Breech, female, anterior placenta Bilateral cerebral ventriculomegaly (Left 1.2 cm; Right 1.4 cm) Remainder of the fetal cranial anatomy appears normal Growth is appropriate (43rd %tile) Normal amniotic fluid volume  TVUS - cervical length 4.1 cm without funneling or dynamic changes  Recommendations: Recommend follow-up ultrasound examination for cervical length in 2 weeks Follow up growth scan in 4 weeks  Alpha Gula, MD

## 2012-06-20 NOTE — Patient Instructions (Addendum)
Breastfeeding Deciding to breastfeed is one of the best choices you can make for you and your baby. The information that follows gives a brief overview of the benefits of breastfeeding as well as common topics surrounding breastfeeding. BENEFITS OF BREASTFEEDING For the baby  The first milk (colostrum) helps the baby's digestive system function better.   There are antibodies in the mother's milk that help the baby fight off infections.   The baby has a lower incidence of asthma, allergies, and sudden infant death syndrome (SIDS).   The nutrients in breast milk are better for the baby than infant formulas, and breast milk helps the baby's brain grow better.   Babies who breastfeed have less gas, colic, and constipation.  For the mother  Breastfeeding helps develop a very special bond between the mother and her baby.   Breastfeeding is convenient, always available at the correct temperature, and costs nothing.   Breastfeeding burns calories in the mother and helps her lose weight that was gained during pregnancy.   Breastfeeding makes the uterus contract back down to normal size faster and slows bleeding following delivery.   Breastfeeding mothers have a lower risk of developing breast cancer.  BREASTFEEDING FREQUENCY  A healthy, full-term baby may breastfeed as often as every hour or space his or her feedings to every 3 hours.   Watch your baby for signs of hunger. Nurse your baby if he or she shows signs of hunger. How often you nurse will vary from baby to baby.   Nurse as often as the baby requests, or when you feel the need to reduce the fullness of your breasts.   Awaken the baby if it has been 3 4 hours since the last feeding.   Frequent feeding will help the mother make more milk and will help prevent problems, such as sore nipples and engorgement of the breasts.  BABY'S POSITION AT THE BREAST  Whether lying down or sitting, be sure that the baby's tummy is  facing your tummy.   Support the breast with 4 fingers underneath the breast and the thumb above. Make sure your fingers are well away from the nipple and baby's mouth.   Stroke the baby's lips gently with your finger or nipple.   When the baby's mouth is open wide enough, place all of your nipple and as much of the areola as possible into your baby's mouth.   Pull the baby in close so the tip of the nose and the baby's cheeks touch the breast during the feeding.  FEEDINGS AND SUCTION  The length of each feeding varies from baby to baby and from feeding to feeding.   The baby must suck about 2 3 minutes for your milk to get to him or her. This is called a "let down." For this reason, allow the baby to feed on each breast as long as he or she wants. Your baby will end the feeding when he or she has received the right balance of nutrients.   To break the suction, put your finger into the corner of the baby's mouth and slide it between his or her gums before removing your breast from his or her mouth. This will help prevent sore nipples.  HOW TO TELL WHETHER YOUR BABY IS GETTING ENOUGH BREAST MILK. Wondering whether or not your baby is getting enough milk is a common concern among mothers. You can be assured that your baby is getting enough milk if:   Your baby is actively   sucking and you hear swallowing.   Your baby seems relaxed and satisfied after a feeding.   Your baby nurses at least 8 12 times in a 24 hour time period. Nurse your baby until he or she unlatches or falls asleep at the first breast (at least 10 20 minutes), then offer the second side.   Your baby is wetting 5 6 disposable diapers (6 8 cloth diapers) in a 24 hour period by 5 6 days of age.   Your baby is having at least 3 4 stools every 24 hours for the first 6 weeks. The stool should be soft and yellow.   Your baby should gain 4 7 ounces per week after he or she is 4 days old.   Your breasts feel softer  after nursing.  REDUCING BREAST ENGORGEMENT  In the first week after your baby is born, you may experience signs of breast engorgement. When breasts are engorged, they feel heavy, warm, full, and may be tender to the touch. You can reduce engorgement if you:   Nurse frequently, every 2 3 hours. Mothers who breastfeed early and often have fewer problems with engorgement.   Place light ice packs on your breasts for 10 20 minutes between feedings. This reduces swelling. Wrap the ice packs in a lightweight towel to protect your skin. Bags of frozen vegetables work well for this purpose.   Take a warm shower or apply warm, moist heat to your breast for 5 10 minutes just before each feeding. This increases circulation and helps the milk flow.   Gently massage your breast before and during the feeding. Using your finger tips, massage from the chest wall towards your nipple in a circular motion.   Make sure that the baby empties at least one breast at every feeding before switching sides.   Use a breast pump to empty the breasts if your baby is sleepy or not nursing well. You may also want to pump if you are returning to work oryou feel you are getting engorged.   Avoid bottle feeds, pacifiers, or supplemental feedings of water or juice in place of breastfeeding. Breast milk is all the food your baby needs. It is not necessary for your baby to have water or formula. In fact, to help your breasts make more milk, it is best not to give your baby supplemental feedings during the early weeks.   Be sure the baby is latched on and positioned properly while breastfeeding.   Wear a supportive bra, avoiding underwire styles.   Eat a balanced diet with enough fluids.   Rest often, relax, and take your prenatal vitamins to prevent fatigue, stress, and anemia.  If you follow these suggestions, your engorgement should improve in 24 48 hours. If you are still experiencing difficulty, call your  lactation consultant or caregiver.  CARING FOR YOURSELF Take care of your breasts  Bathe or shower daily.   Avoid using soap on your nipples.   Start feedings on your left breast at one feeding and on your right breast at the next feeding.   You will notice an increase in your milk supply 2 5 days after delivery. You may feel some discomfort from engorgement, which makes your breasts very firm and often tender. Engorgement "peaks" out within 24 48 hours. In the meantime, apply warm moist towels to your breasts for 5 10 minutes before feeding. Gentle massage and expression of some milk before feeding will soften your breasts, making it easier for your   baby to latch on.   Wear a well-fitting nursing bra, and air dry your nipples for a 3 4minutes after each feeding.   Only use cotton bra pads.   Only use pure lanolin on your nipples after nursing. You do not need to wash it off before feeding the baby again. Another option is to express a few drops of breast milk and gently massage it into your nipples.  Take care of yourself  Eat well-balanced meals and nutritious snacks.   Drinking milk, fruit juice, and water to satisfy your thirst (about 8 glasses a day).   Get plenty of rest.  Avoid foods that you notice affect the baby in a bad way.  SEEK MEDICAL CARE IF:   You have difficulty with breastfeeding and need help.   You have a hard, red, sore area on your breast that is accompanied by a fever.   Your baby is too sleepy to eat well or is having trouble sleeping.   Your baby is wetting less than 6 diapers a day, by 5 days of age.   Your baby's skin or white part of his or her eyes is more yellow than it was in the hospital.   You feel depressed.  Document Released: 02/13/2005 Document Revised: 08/15/2011 Document Reviewed: 05/14/2011 [employer] Patient Information 2013 [employer].  

## 2012-06-20 NOTE — Progress Notes (Signed)
U/s today showed concordant growth both babies, CL 4.1 cm Continue 17 P--ventriculomegaly is stable.

## 2012-06-27 ENCOUNTER — Ambulatory Visit: Payer: Self-pay

## 2012-06-27 ENCOUNTER — Ambulatory Visit (INDEPENDENT_AMBULATORY_CARE_PROVIDER_SITE_OTHER): Payer: Medicaid Other

## 2012-06-27 VITALS — BP 113/75 | HR 81 | Temp 98.1°F | Wt 159.5 lb

## 2012-06-27 DIAGNOSIS — O09219 Supervision of pregnancy with history of pre-term labor, unspecified trimester: Secondary | ICD-10-CM

## 2012-07-04 ENCOUNTER — Ambulatory Visit (HOSPITAL_COMMUNITY)
Admission: RE | Admit: 2012-07-04 | Discharge: 2012-07-04 | Disposition: A | Discharge status: Home / Self Care | Payer: Medicaid Other | Source: Ambulatory Visit | Attending: Obstetrics and Gynecology | Admitting: Obstetrics and Gynecology

## 2012-07-04 ENCOUNTER — Encounter: Payer: Self-pay | Admitting: Obstetrics and Gynecology

## 2012-07-04 ENCOUNTER — Ambulatory Visit (INDEPENDENT_AMBULATORY_CARE_PROVIDER_SITE_OTHER): Payer: Medicaid Other | Admitting: Obstetrics and Gynecology

## 2012-07-04 VITALS — BP 100/62 | HR 90 | Wt 167.2 lb

## 2012-07-04 VITALS — BP 107/63 | Temp 97.2°F | Wt 165.8 lb

## 2012-07-04 DIAGNOSIS — O30009 Twin pregnancy, unspecified number of placenta and unspecified number of amniotic sacs, unspecified trimester: Secondary | ICD-10-CM

## 2012-07-04 DIAGNOSIS — Z283 Underimmunization status: Secondary | ICD-10-CM

## 2012-07-04 DIAGNOSIS — O34219 Maternal care for unspecified type scar from previous cesarean delivery: Secondary | ICD-10-CM | POA: Insufficient documentation

## 2012-07-04 DIAGNOSIS — O09219 Supervision of pregnancy with history of pre-term labor, unspecified trimester: Secondary | ICD-10-CM

## 2012-07-04 DIAGNOSIS — O358XX0 Maternal care for other (suspected) fetal abnormality and damage, not applicable or unspecified: Secondary | ICD-10-CM

## 2012-07-04 DIAGNOSIS — O9989 Other specified diseases and conditions complicating pregnancy, childbirth and the puerperium: Secondary | ICD-10-CM

## 2012-07-04 DIAGNOSIS — O359XX2 Maternal care for (suspected) fetal abnormality and damage, unspecified, fetus 2: Secondary | ICD-10-CM

## 2012-07-04 DIAGNOSIS — E0590 Thyrotoxicosis, unspecified without thyrotoxic crisis or storm: Secondary | ICD-10-CM

## 2012-07-04 DIAGNOSIS — O09299 Supervision of pregnancy with other poor reproductive or obstetric history, unspecified trimester: Secondary | ICD-10-CM | POA: Insufficient documentation

## 2012-07-04 DIAGNOSIS — F17200 Nicotine dependence, unspecified, uncomplicated: Secondary | ICD-10-CM

## 2012-07-04 DIAGNOSIS — Z8751 Personal history of pre-term labor: Secondary | ICD-10-CM | POA: Insufficient documentation

## 2012-07-04 DIAGNOSIS — O09212 Supervision of pregnancy with history of pre-term labor, second trimester: Secondary | ICD-10-CM

## 2012-07-04 LAB — POCT URINALYSIS DIP (DEVICE)
Glucose, UA: NEGATIVE mg/dL
Hgb urine dipstick: NEGATIVE
Nitrite: NEGATIVE
Protein, ur: 30 mg/dL — AB
Specific Gravity, Urine: >=1.03 (ref 1.005–1.030)
Urobilinogen, UA: 1 mg/dL (ref 0.0–1.0)
pH: 6.5 (ref 5.0–8.0)

## 2012-07-04 NOTE — Progress Notes (Signed)
Patient doing well without complaints. CL on 5/8 was 3.7cm. Continue weekly 17-P. F/U MFM scan in 2 weeks for CL and ventriculomegaly in twin B. FM/PTL precautions reviewed

## 2012-07-04 NOTE — Progress Notes (Signed)
P=113, c/o of a lot of cramping last few day, also c/o " something is wrong with my belly button- c/o pulling pain when she moves too fast"

## 2012-07-10 ENCOUNTER — Telehealth: Payer: Self-pay

## 2012-07-10 NOTE — Telephone Encounter (Signed)
Laurie Hall called back and left a message she was calling to see if the paper was sent to her attorney. Called Laurie Hall and informed her another nurse has sent the paper to her attorney.

## 2012-07-10 NOTE — Telephone Encounter (Signed)
Patient needs proof of pregnancy faxed to her attorney.  I have printed a verification letter and will have it faxed today.  Patient had no other questions and voiced understanding.

## 2012-07-11 ENCOUNTER — Ambulatory Visit: Payer: Self-pay

## 2012-07-11 ENCOUNTER — Encounter: Payer: Self-pay | Admitting: Family Medicine

## 2012-07-12 ENCOUNTER — Encounter (HOSPITAL_COMMUNITY): Payer: Self-pay | Admitting: *Deleted

## 2012-07-12 ENCOUNTER — Inpatient Hospital Stay (HOSPITAL_COMMUNITY)
Admission: AD | Admit: 2012-07-12 | Discharge: 2012-07-12 | Disposition: A | Discharge status: Home / Self Care | Payer: Medicaid Other | Source: Ambulatory Visit | Attending: Obstetrics & Gynecology | Admitting: Obstetrics & Gynecology

## 2012-07-12 ENCOUNTER — Ambulatory Visit: Payer: Self-pay

## 2012-07-12 DIAGNOSIS — A499 Bacterial infection, unspecified: Secondary | ICD-10-CM | POA: Insufficient documentation

## 2012-07-12 DIAGNOSIS — IMO0002 Reserved for concepts with insufficient information to code with codable children: Secondary | ICD-10-CM | POA: Insufficient documentation

## 2012-07-12 DIAGNOSIS — O2390 Unspecified genitourinary tract infection in pregnancy, unspecified trimester: Secondary | ICD-10-CM | POA: Insufficient documentation

## 2012-07-12 DIAGNOSIS — N760 Acute vaginitis: Secondary | ICD-10-CM | POA: Insufficient documentation

## 2012-07-12 DIAGNOSIS — O30009 Twin pregnancy, unspecified number of placenta and unspecified number of amniotic sacs, unspecified trimester: Secondary | ICD-10-CM | POA: Insufficient documentation

## 2012-07-12 DIAGNOSIS — B9689 Other specified bacterial agents as the cause of diseases classified elsewhere: Secondary | ICD-10-CM | POA: Insufficient documentation

## 2012-07-12 DIAGNOSIS — N949 Unspecified condition associated with female genital organs and menstrual cycle: Secondary | ICD-10-CM | POA: Insufficient documentation

## 2012-07-12 DIAGNOSIS — O4700 False labor before 37 completed weeks of gestation, unspecified trimester: Secondary | ICD-10-CM | POA: Insufficient documentation

## 2012-07-12 LAB — WET PREP, GENITAL: Trich, Wet Prep: NONE SEEN

## 2012-07-12 LAB — URINALYSIS, ROUTINE W REFLEX MICROSCOPIC
Bilirubin Urine: NEGATIVE
Glucose, UA: NEGATIVE mg/dL
Hgb urine dipstick: NEGATIVE
Ketones, ur: NEGATIVE mg/dL
Protein, ur: NEGATIVE mg/dL
Urobilinogen, UA: 0.2 mg/dL (ref 0.0–1.0)

## 2012-07-12 LAB — URINE MICROSCOPIC-ADD ON

## 2012-07-12 MED ORDER — METRONIDAZOLE 500 MG PO TABS: 500.0000 mg | ORAL_TABLET | Freq: Two times a day (BID) | ORAL | Status: DC

## 2012-07-12 NOTE — MAU Note (Signed)
Pt reports increased vag discharge with odor.

## 2012-07-12 NOTE — MAU Provider Note (Signed)
History     CSN: [id number]  Arrival date and time: 07/12/12 1209   None     Chief Complaint  Patient presents with  . Vaginal Discharge   HPI 27 y.o. [id number] at [redacted]w[redacted]d with twins c/o intermittent swelling in feet and hands which has recently worsened, no swelling today, though. Resolves with rest. Also c/o vaginal discharge with odor. Occasional BH contractions. No bleeding.   Past Medical History  Diagnosis Date  . Urinary tract infection   . Meningitis   . Ovarian cyst   . Abnormal Pap smear     x4, repeats ok    Past Surgical History  Procedure Laterality Date  . Cesarean section    . Therapeutic abortion      Family History  Problem Relation Age of Onset  . Other Neg Hx   . Hyperthyroidism Mother   . Heart disease Mother   . Diabetes Mother   . Hyperthyroidism Maternal Aunt   . Hypothyroidism Maternal Grandmother   . Heart murmur Maternal Grandmother   . Diabetes Maternal Grandmother   . Hyperthyroidism Maternal Grandfather   . Heart disease Maternal Grandfather     History  Substance Use Topics  . Smoking status: Current Every Day Smoker -- 0.25 packs/day for 8 years    Types: Cigarettes  . Smokeless tobacco: Never Used  . Alcohol Use: No     Comment: social    Allergies: No Known Allergies  Facility-administered medications prior to admission  Medication Dose Route Frequency Provider Last Rate Last Dose  . hydroxyprogesterone caproate (DELALUTIN) 250 mg/mL injection 250 mg  250 mg Intramuscular Weekly Adam Phenix, MD   250 mg at 07/12/12 1207   Prescriptions prior to admission  Medication Sig Dispense Refill  . hydroxyprogesterone caproate (DELALUTIN) 250 mg/mL OIL Inject 250 mg into the muscle once. Patient takes on Fridays      . Prenatal Vit-Fe Fumarate-FA (PRENATAL MULTIVITAMIN) TABS Take 1 tablet by mouth at bedtime.      . [DISCONTINUED] hydroxyprogesterone caproate (DELALUTIN) 250 mg/mL OIL Inject 1 mL (250 mg total) into the muscle  once.  0.98 mL  5    Review of Systems  Constitutional: Negative.   Respiratory: Negative.   Cardiovascular: Negative.   Gastrointestinal: Negative for nausea, vomiting, abdominal pain, diarrhea and constipation.  Genitourinary: Negative for dysuria, urgency, frequency, hematuria and flank pain.       + discharge   Musculoskeletal: Negative.   Neurological: Negative.   Psychiatric/Behavioral: Negative.    Physical Exam   Blood pressure 97/66, pulse 90, temperature 99.1 F (37.3 C), temperature source Oral, resp. rate 18, height 5\' 10"  (1.778 m), weight 167 lb (75.751 kg), last menstrual period 12/12/2011.  Physical Exam  Nursing note and vitals reviewed. Constitutional: She is oriented to person, place, and time. She appears well-developed and well-nourished. No distress.  Cardiovascular: Normal rate.   Respiratory: Effort normal.  GI: Soft. There is no tenderness.  Musculoskeletal: Normal range of motion. She exhibits no edema and no tenderness.  Neurological: She is alert and oriented to person, place, and time.  Skin: Skin is warm and dry.  Psychiatric: She has a normal mood and affect.   NST reassuring at 25 weeks x 2, TOCO shows rare UC MAU Course  Procedures  Results for orders placed during the hospital encounter of 07/12/12 (from the past 24 hour(s))  URINALYSIS, ROUTINE W REFLEX MICROSCOPIC     Status: Abnormal   Collection Time  07/12/12 12:20 PM      Result Value Range   Color, Urine YELLOW  YELLOW   APPearance CLEAR  CLEAR   Specific Gravity, Urine 1.025  1.005 - 1.030   pH 7.0  5.0 - 8.0   Glucose, UA NEGATIVE  NEGATIVE mg/dL   Hgb urine dipstick NEGATIVE  NEGATIVE   Bilirubin Urine NEGATIVE  NEGATIVE   Ketones, ur NEGATIVE  NEGATIVE mg/dL   Protein, ur NEGATIVE  NEGATIVE mg/dL   Urobilinogen, UA 0.2  0.0 - 1.0 mg/dL   Nitrite NEGATIVE  NEGATIVE   Leukocytes, UA SMALL (*) NEGATIVE  URINE MICROSCOPIC-ADD ON     Status: Abnormal   Collection Time     07/12/12 12:20 PM      Result Value Range   Squamous Epithelial / LPF FEW (*) RARE   WBC, UA 0-2  <3 WBC/hpf  WET PREP, GENITAL     Status: Abnormal   Collection Time    07/12/12  1:29 PM      Result Value Range   Yeast Wet Prep HPF POC NONE SEEN  NONE SEEN   Trich, Wet Prep NONE SEEN  NONE SEEN   Clue Cells Wet Prep HPF POC MODERATE (*) NONE SEEN   WBC, Wet Prep HPF POC MODERATE (*) NONE SEEN     Assessment and Plan  27 y.o. [id number] at [redacted]w[redacted]d with twins 1. BV (bacterial vaginosis)    Intermittent mild dependent edema normal at this point in pregnancy, rev'd precautions and normal pregnancy discomforts, f/u as scheduled or sooner PRN    Medication List    TAKE these medications       hydroxyprogesterone caproate 250 mg/mL Oil  Commonly known as:  DELALUTIN  Inject 250 mg into the muscle once. Patient takes on Fridays     metroNIDAZOLE 500 MG tablet  Commonly known as:  FLAGYL  Take 1 tablet (500 mg total) by mouth 2 (two) times daily.     prenatal multivitamin Tabs  Take 1 tablet by mouth at bedtime.            Follow-up Information   Follow up with [facility]. (as scheduled)    Contact information:   [address] [place] [address] [phone]        Marnee Sherrard 07/12/2012, 1:18 PM

## 2012-07-13 LAB — GC/CHLAMYDIA PROBE AMP
CT Probe RNA: NEGATIVE
GC Probe RNA: NEGATIVE

## 2012-07-13 NOTE — MAU Provider Note (Signed)
Attestation of Attending Supervision of Advanced Practitioner (CNM/NP): Evaluation and management procedures were performed by the Advanced Practitioner under my supervision and collaboration.  I have reviewed the Advanced Practitioner's note and chart, and I agree with the management and plan.  HARRAWAY-SMITH, Kathalina Ostermann 5:37 PM     

## 2012-07-16 ENCOUNTER — Telehealth: Payer: Self-pay | Admitting: *Deleted

## 2012-07-16 NOTE — Telephone Encounter (Signed)
Pt left message stating that she has a question about 17P injection and another injection that she received.

## 2012-07-18 ENCOUNTER — Other Ambulatory Visit (HOSPITAL_COMMUNITY): Payer: Self-pay | Admitting: Maternal and Fetal Medicine

## 2012-07-18 ENCOUNTER — Ambulatory Visit (HOSPITAL_COMMUNITY)
Admission: RE | Admit: 2012-07-18 | Discharge: 2012-07-18 | Disposition: A | Discharge status: Home / Self Care | Payer: Medicaid Other | Source: Ambulatory Visit | Attending: Obstetrics and Gynecology | Admitting: Obstetrics and Gynecology

## 2012-07-18 ENCOUNTER — Encounter: Payer: Self-pay | Admitting: Obstetrics and Gynecology

## 2012-07-18 ENCOUNTER — Ambulatory Visit (INDEPENDENT_AMBULATORY_CARE_PROVIDER_SITE_OTHER): Payer: Medicaid Other | Admitting: Obstetrics and Gynecology

## 2012-07-18 ENCOUNTER — Encounter (HOSPITAL_COMMUNITY): Payer: Self-pay

## 2012-07-18 VITALS — BP 114/71 | Temp 97.1°F | Wt 170.3 lb

## 2012-07-18 DIAGNOSIS — O358XX0 Maternal care for other (suspected) fetal abnormality and damage, not applicable or unspecified: Secondary | ICD-10-CM

## 2012-07-18 DIAGNOSIS — Z8751 Personal history of pre-term labor: Secondary | ICD-10-CM | POA: Insufficient documentation

## 2012-07-18 DIAGNOSIS — O09219 Supervision of pregnancy with history of pre-term labor, unspecified trimester: Secondary | ICD-10-CM

## 2012-07-18 DIAGNOSIS — O30009 Twin pregnancy, unspecified number of placenta and unspecified number of amniotic sacs, unspecified trimester: Secondary | ICD-10-CM | POA: Insufficient documentation

## 2012-07-18 DIAGNOSIS — O34219 Maternal care for unspecified type scar from previous cesarean delivery: Secondary | ICD-10-CM

## 2012-07-18 DIAGNOSIS — O359XX2 Maternal care for (suspected) fetal abnormality and damage, unspecified, fetus 2: Secondary | ICD-10-CM

## 2012-07-18 DIAGNOSIS — O9989 Other specified diseases and conditions complicating pregnancy, childbirth and the puerperium: Secondary | ICD-10-CM

## 2012-07-18 DIAGNOSIS — E0590 Thyrotoxicosis, unspecified without thyrotoxic crisis or storm: Secondary | ICD-10-CM

## 2012-07-18 DIAGNOSIS — O09212 Supervision of pregnancy with history of pre-term labor, second trimester: Secondary | ICD-10-CM

## 2012-07-18 DIAGNOSIS — Z283 Underimmunization status: Secondary | ICD-10-CM

## 2012-07-18 DIAGNOSIS — O09299 Supervision of pregnancy with other poor reproductive or obstetric history, unspecified trimester: Secondary | ICD-10-CM | POA: Insufficient documentation

## 2012-07-18 LAB — POCT URINALYSIS DIP (DEVICE)
Leukocytes, UA: NEGATIVE
Nitrite: NEGATIVE
Protein, ur: NEGATIVE mg/dL
Urobilinogen, UA: 0.2 mg/dL (ref 0.0–1.0)

## 2012-07-18 LAB — CBC
MCH: 32.6 pg (ref 26.0–34.0)
MCHC: 34.7 g/dL (ref 30.0–36.0)
Platelets: 172 10*3/uL (ref 150–400)

## 2012-07-18 MED ORDER — HYDROXYPROGESTERONE CAPROATE 250 MG/ML IM OIL
250.0000 mg | TOPICAL_OIL | Freq: Once | INTRAMUSCULAR | Status: AC
  Administered 2012-07-18: 250 mg via INTRAMUSCULAR

## 2012-07-18 NOTE — Progress Notes (Signed)
HR= 87 °

## 2012-07-18 NOTE — Addendum Note (Signed)
Addended by: Franchot Mimes on: 07/18/2012 11:04 AM   Modules accepted: Orders

## 2012-07-18 NOTE — Telephone Encounter (Signed)
Called the patient to address her questions and got a message that the number is invalid.  I also tried her alternate contact (her mother) and received the same message.  There was no option to leave a voicemail.  The patient was in the clinic this morning for her scheduled appointment.

## 2012-07-18 NOTE — Progress Notes (Signed)
Patient doing well without complaints. FM/PTL precautions discussed. Continue weekly 17-P. F/U MFM ultrasound today. 1hr GCT and labs today

## 2012-07-18 NOTE — Addendum Note (Signed)
Addended by: Faythe Casa on: 07/18/2012 11:43 AM   Modules accepted: Orders

## 2012-07-18 NOTE — Telephone Encounter (Signed)
Pt was seen in clinic today

## 2012-07-19 LAB — TSH: TSH: 0.511 u[IU]/mL (ref 0.350–4.500)

## 2012-07-19 LAB — RPR

## 2012-07-21 ENCOUNTER — Encounter: Payer: Self-pay | Admitting: Obstetrics and Gynecology

## 2012-07-25 ENCOUNTER — Ambulatory Visit (INDEPENDENT_AMBULATORY_CARE_PROVIDER_SITE_OTHER): Payer: Medicaid Other | Admitting: General Practice

## 2012-07-25 VITALS — BP 118/70 | HR 88 | Temp 97.4°F | Ht 70.0 in | Wt 172.5 lb

## 2012-07-25 DIAGNOSIS — O09219 Supervision of pregnancy with history of pre-term labor, unspecified trimester: Secondary | ICD-10-CM

## 2012-07-25 DIAGNOSIS — O09212 Supervision of pregnancy with history of pre-term labor, second trimester: Secondary | ICD-10-CM

## 2012-07-25 MED ORDER — HYDROXYPROGESTERONE CAPROATE 250 MG/ML IM OIL
250.0000 mg | TOPICAL_OIL | Freq: Once | INTRAMUSCULAR | Status: AC
  Administered 2012-07-25: 250 mg via INTRAMUSCULAR

## 2012-07-26 ENCOUNTER — Telehealth: Payer: Self-pay | Admitting: *Deleted

## 2012-07-26 NOTE — Telephone Encounter (Signed)
Called patient back and heard message that voicemail is not set up.  

## 2012-07-26 NOTE — Telephone Encounter (Signed)
Patient left a message requesting a call back. 

## 2012-07-29 ENCOUNTER — Telehealth: Payer: Self-pay

## 2012-07-29 NOTE — Telephone Encounter (Signed)
Called patient back and heard message stating this number is no longer valid. Patient has appt on 6/5

## 2012-07-29 NOTE — Telephone Encounter (Signed)
Pt stated "I would like to know something that I can take for my cold."

## 2012-07-30 NOTE — Telephone Encounter (Signed)
Called patient back and heard message that the number you are calling is not valid. No other numbers on file for patient.

## 2012-08-01 ENCOUNTER — Ambulatory Visit (INDEPENDENT_AMBULATORY_CARE_PROVIDER_SITE_OTHER): Payer: Medicaid Other | Admitting: Obstetrics & Gynecology

## 2012-08-01 VITALS — BP 109/71 | Wt 172.0 lb

## 2012-08-01 DIAGNOSIS — O34219 Maternal care for unspecified type scar from previous cesarean delivery: Secondary | ICD-10-CM

## 2012-08-01 DIAGNOSIS — O09219 Supervision of pregnancy with history of pre-term labor, unspecified trimester: Secondary | ICD-10-CM

## 2012-08-01 DIAGNOSIS — O09212 Supervision of pregnancy with history of pre-term labor, second trimester: Secondary | ICD-10-CM

## 2012-08-01 LAB — POCT URINALYSIS DIP (DEVICE)
Hgb urine dipstick: NEGATIVE
Nitrite: NEGATIVE
Protein, ur: 30 mg/dL — AB
pH: 6.5 (ref 5.0–8.0)

## 2012-08-01 MED ORDER — HYDROXYPROGESTERONE CAPROATE 250 MG/ML IM OIL
250.0000 mg | TOPICAL_OIL | Freq: Once | INTRAMUSCULAR | Status: AC
  Administered 2012-08-01: 250 mg via INTRAMUSCULAR

## 2012-08-01 NOTE — Progress Notes (Signed)
!&   p today. Has gotten used to [facility], no LOF  09/25/12 for repeat CS due to previous classical  36 weeks

## 2012-08-01 NOTE — Patient Instructions (Signed)

## 2012-08-01 NOTE — Progress Notes (Signed)
Pulse- 98 

## 2012-08-08 ENCOUNTER — Ambulatory Visit (INDEPENDENT_AMBULATORY_CARE_PROVIDER_SITE_OTHER): Payer: Medicaid Other

## 2012-08-08 VITALS — BP 114/68 | HR 89 | Temp 97.1°F | Wt 174.0 lb

## 2012-08-08 DIAGNOSIS — O09219 Supervision of pregnancy with history of pre-term labor, unspecified trimester: Secondary | ICD-10-CM

## 2012-08-08 MED ORDER — HYDROXYPROGESTERONE CAPROATE 250 MG/ML IM OIL
250.0000 mg | TOPICAL_OIL | INTRAMUSCULAR | Status: DC
  Administered 2012-08-08 – 2012-08-29 (×4): 250 mg via INTRAMUSCULAR

## 2012-08-10 ENCOUNTER — Encounter (HOSPITAL_COMMUNITY): Payer: Self-pay | Admitting: *Deleted

## 2012-08-10 ENCOUNTER — Inpatient Hospital Stay (HOSPITAL_COMMUNITY)
Admission: AD | Admit: 2012-08-10 | Discharge: 2012-08-13 | DRG: 778 | Disposition: A | Discharge status: Home / Self Care | Payer: Medicaid Other | Source: Ambulatory Visit | Attending: Obstetrics and Gynecology | Admitting: Obstetrics and Gynecology

## 2012-08-10 DIAGNOSIS — O30009 Twin pregnancy, unspecified number of placenta and unspecified number of amniotic sacs, unspecified trimester: Secondary | ICD-10-CM

## 2012-08-10 DIAGNOSIS — O4700 False labor before 37 completed weeks of gestation, unspecified trimester: Secondary | ICD-10-CM

## 2012-08-10 DIAGNOSIS — R109 Unspecified abdominal pain: Secondary | ICD-10-CM | POA: Diagnosis present

## 2012-08-10 DIAGNOSIS — Z283 Underimmunization status: Secondary | ICD-10-CM

## 2012-08-10 DIAGNOSIS — E0590 Thyrotoxicosis, unspecified without thyrotoxic crisis or storm: Secondary | ICD-10-CM | POA: Diagnosis present

## 2012-08-10 DIAGNOSIS — O09219 Supervision of pregnancy with history of pre-term labor, unspecified trimester: Secondary | ICD-10-CM

## 2012-08-10 DIAGNOSIS — E079 Disorder of thyroid, unspecified: Secondary | ICD-10-CM | POA: Diagnosis present

## 2012-08-10 DIAGNOSIS — O99280 Endocrine, nutritional and metabolic diseases complicating pregnancy, unspecified trimester: Secondary | ICD-10-CM | POA: Diagnosis present

## 2012-08-10 DIAGNOSIS — O09212 Supervision of pregnancy with history of pre-term labor, second trimester: Secondary | ICD-10-CM

## 2012-08-10 DIAGNOSIS — O30049 Twin pregnancy, dichorionic/diamniotic, unspecified trimester: Secondary | ICD-10-CM

## 2012-08-10 DIAGNOSIS — O34219 Maternal care for unspecified type scar from previous cesarean delivery: Secondary | ICD-10-CM

## 2012-08-10 DIAGNOSIS — O359XX2 Maternal care for (suspected) fetal abnormality and damage, unspecified, fetus 2: Secondary | ICD-10-CM

## 2012-08-10 DIAGNOSIS — O99330 Smoking (tobacco) complicating pregnancy, unspecified trimester: Secondary | ICD-10-CM | POA: Diagnosis present

## 2012-08-10 DIAGNOSIS — Z2839 Other underimmunization status: Secondary | ICD-10-CM

## 2012-08-10 DIAGNOSIS — O4703 False labor before 37 completed weeks of gestation, third trimester: Secondary | ICD-10-CM

## 2012-08-10 LAB — RPR: RPR Ser Ql: NONREACTIVE

## 2012-08-10 LAB — CBC
Hemoglobin: 11.7 g/dL — ABNORMAL LOW (ref 12.0–15.0)
MCH: 33.1 pg (ref 26.0–34.0)
MCHC: 35.7 g/dL (ref 30.0–36.0)
MCV: 92.7 fL (ref 78.0–100.0)

## 2012-08-10 LAB — URINALYSIS, ROUTINE W REFLEX MICROSCOPIC
Bilirubin Urine: NEGATIVE
Hgb urine dipstick: NEGATIVE
Ketones, ur: NEGATIVE mg/dL
Nitrite: NEGATIVE
Urobilinogen, UA: 0.2 mg/dL (ref 0.0–1.0)
pH: 6.5 (ref 5.0–8.0)

## 2012-08-10 LAB — WET PREP, GENITAL
Clue Cells Wet Prep HPF POC: NONE SEEN
Trich, Wet Prep: NONE SEEN
Yeast Wet Prep HPF POC: NONE SEEN

## 2012-08-10 LAB — URINE MICROSCOPIC-ADD ON

## 2012-08-10 MED ORDER — MAGNESIUM SULFATE 40 G IN LACTATED RINGERS - SIMPLE
3.0000 g/h | INTRAVENOUS | Status: AC
  Filled 2012-08-10: qty 500

## 2012-08-10 MED ORDER — ZOLPIDEM TARTRATE 5 MG PO TABS
5.0000 mg | ORAL_TABLET | Freq: Every evening | ORAL | Status: DC | PRN
  Administered 2012-08-11 – 2012-08-13 (×2): 5 mg via ORAL
  Filled 2012-08-10 (×2): qty 1

## 2012-08-10 MED ORDER — MAGNESIUM SULFATE 40 G IN LACTATED RINGERS - SIMPLE
2.0000 g/h | INTRAVENOUS | Status: DC
  Filled 2012-08-10: qty 500

## 2012-08-10 MED ORDER — LACTATED RINGERS IV BOLUS (SEPSIS)
1000.0000 mL | Freq: Once | INTRAVENOUS | Status: AC
  Administered 2012-08-10: 1000 mL via INTRAVENOUS

## 2012-08-10 MED ORDER — MAGNESIUM SULFATE BOLUS VIA INFUSION
4.0000 g | Freq: Once | INTRAVENOUS | Status: AC
  Administered 2012-08-10: 4 g via INTRAVENOUS
  Filled 2012-08-10: qty 500

## 2012-08-10 MED ORDER — HYDROXYPROGESTERONE CAPROATE 250 MG/ML IM OIL: 250.0000 mg | TOPICAL_OIL | INTRAMUSCULAR | Status: DC

## 2012-08-10 MED ORDER — ACETAMINOPHEN 325 MG PO TABS: 650.0000 mg | ORAL_TABLET | ORAL | Status: DC | PRN

## 2012-08-10 MED ORDER — BETAMETHASONE SOD PHOS & ACET 6 (3-3) MG/ML IJ SUSP
12.0000 mg | INTRAMUSCULAR | Status: AC
Start: 2012-08-10 — End: 2012-08-11
  Administered 2012-08-10 – 2012-08-11 (×2): 12 mg via INTRAMUSCULAR
  Filled 2012-08-10 (×2): qty 2

## 2012-08-10 MED ORDER — DOCUSATE SODIUM 100 MG PO CAPS
100.0000 mg | ORAL_CAPSULE | Freq: Every day | ORAL | Status: DC
  Administered 2012-08-11: 100 mg via ORAL
  Filled 2012-08-10 (×2): qty 1

## 2012-08-10 MED ORDER — PRENATAL MULTIVITAMIN CH
1.0000 | ORAL_TABLET | Freq: Every day | ORAL | Status: DC
  Administered 2012-08-11 – 2012-08-13 (×3): 1 via ORAL
  Filled 2012-08-10 (×3): qty 1

## 2012-08-10 MED ORDER — LACTATED RINGERS IV SOLN
INTRAVENOUS | Status: DC
  Administered 2012-08-10 – 2012-08-13 (×6): via INTRAVENOUS

## 2012-08-10 MED ORDER — CALCIUM CARBONATE ANTACID 500 MG PO CHEW
2.0000 | CHEWABLE_TABLET | ORAL | Status: DC | PRN
  Administered 2012-08-12: 400 mg via ORAL
  Filled 2012-08-10: qty 2

## 2012-08-10 NOTE — H&P (Signed)
History     CSN: [id number]  Arrival date and time: 08/10/12 1405   None     Chief Complaint  Patient presents with  . Abdominal Cramping   HPI Laurie Hall is a 27 y.o. [id number] female [redacted]w[redacted]d by 7wk u/s, who presents w/ report of losing mucous plug yesterday, then began to cramp later on last night, and has gotten worse throughout the day today- feels q 5-51mins, now also has some pelvic pressure. Reports good fm x 2. Denies lof or vb. Denies urinary frequency, hesitancy, urgency, or dysuria.  Denies abnormal or malodorous vaginal d/c or vulvovaginal irritation. No sex or anything in vagina in last 24 hours.   H/O spontaneous PTB x 3 @ 25, 36, and 30wks, has been receiving 17P during this pregnancy, next shot on Thurs, next visit on 6/26. Last CL @ 26.1wks 3.4cm. Early 1hr glucola 121, repeat 106. Quad screen normal, NIPT low r/f aneuploidy. 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 H/o classical c/s w/ 30wk delivery d/t malpresentation/labor/rom. Did have one term SVD @ 39wks after 25wk delivery.   OB History   Grav Para Term Preterm Abortions TAB SAB Ect Mult Living   7 4 1 3 2 1 1  0 0 3      Past Medical History  Diagnosis Date  . Urinary tract infection   . Meningitis   . Ovarian cyst   . Abnormal Pap smear     x4, repeats ok    Past Surgical History  Procedure Laterality Date  . Cesarean section    . Therapeutic abortion      Family History  Problem Relation Age of Onset  . Other Neg Hx   . Hyperthyroidism Mother   . Heart disease Mother   . Diabetes Mother   . Hyperthyroidism Maternal Aunt   . Hypothyroidism Maternal Grandmother   . Heart murmur Maternal Grandmother   . Diabetes Maternal Grandmother   . Hyperthyroidism Maternal Grandfather   . Heart disease Maternal Grandfather     History  Substance Use Topics  . Smoking status: Current Every Day Smoker -- 0.25 packs/day for 8 years    Types: Cigarettes  . Smokeless tobacco: Never Used  .  Alcohol Use: No     Comment: social    Allergies: No Known Allergies  Facility-administered medications prior to admission  Medication Dose Route Frequency Provider Last Rate Last Dose  . hydroxyprogesterone caproate (DELALUTIN) 250 mg/mL injection 250 mg  250 mg Intramuscular Weekly Peggy Constant, MD   250 mg at 08/08/12 1041   Prescriptions prior to admission  Medication Sig Dispense Refill  . Prenatal Vit-Fe Fumarate-FA (PRENATAL MULTIVITAMIN) TABS Take 1 tablet by mouth at bedtime.        Review of Systems  Constitutional: Negative.   HENT: Negative.   Eyes: Negative.   Respiratory: Negative.   Cardiovascular: Negative.   Gastrointestinal: Positive for abdominal pain (pelvic cramping).  Genitourinary: Negative.   Musculoskeletal: Negative.   Skin: Negative.   Neurological: Negative.   Endo/Heme/Allergies: Negative.   Psychiatric/Behavioral: Negative.    Physical Exam   Blood pressure 107/62, pulse 91, temperature 97.8 F (36.6 C), temperature source Oral, resp. rate 18, last menstrual period 12/12/2011.  Physical Exam  Constitutional: She is oriented to person, place, and time. She appears well-developed and well-nourished.  HENT:  Head: Normocephalic.  Neck: Normal range of motion.  Cardiovascular: Normal rate.   Respiratory: Effort normal.  GI: Soft.  gravid  Genitourinary: Vagina normal  and uterus normal.  Spec exam: small amount thin white creamy nonodorous d/c, cx visually closed. FFN, wet prep, gc/ch, and gbs obtained.   SVE: outer os 1, unable to reach inner os/th/-3  Musculoskeletal: Normal range of motion.  Neurological: She is alert and oriented to person, place, and time. She has normal reflexes.  Skin: Skin is warm and dry.  Psychiatric: She has a normal mood and affect. Her behavior is normal. Judgment and thought content normal.   FHR A: 135, mod variability, +accels, no decels=Cat I FHR B: 145, mod variability, +accels, no decels=Cat I UCs: 2  uc's w/ ui  MAU Course  Procedures  EFM UA Spec exam w/ fFN, wet prep, gc/ch, gbs collected but not sent yet  SVE  1525: Notified Dr. Jolayne Panther of pt, will do IV hydration and procardia if needed, w/ repeat SVE 1625: Has received 1L LR bolus, states cramping is the same, not as much uterine activity noted on EFM, SVE: no change, fFN+.            Notified Dr. Jolayne Panther. To admit pt, magnesium x 12hr, and BMZ x 2  Results for orders placed during the hospital encounter of 08/10/12 (from the past 24 hour(s))  URINALYSIS, ROUTINE W REFLEX MICROSCOPIC     Status: Abnormal   Collection Time    08/10/12  2:15 PM      Result Value Range   Color, Urine YELLOW  YELLOW   APPearance CLEAR  CLEAR   Specific Gravity, Urine 1.010  1.005 - 1.030   pH 6.5  5.0 - 8.0   Glucose, UA NEGATIVE  NEGATIVE mg/dL   Hgb urine dipstick NEGATIVE  NEGATIVE   Bilirubin Urine NEGATIVE  NEGATIVE   Ketones, ur NEGATIVE  NEGATIVE mg/dL   Protein, ur NEGATIVE  NEGATIVE mg/dL   Urobilinogen, UA 0.2  0.0 - 1.0 mg/dL   Nitrite NEGATIVE  NEGATIVE   Leukocytes, UA SMALL (*) NEGATIVE  URINE MICROSCOPIC-ADD ON     Status: Abnormal   Collection Time    08/10/12  2:15 PM      Result Value Range   Squamous Epithelial / LPF FEW (*) RARE   WBC, UA 7-10  <3 WBC/hpf   RBC / HPF 0-2  <3 RBC/hpf   Bacteria, UA FEW (*) RARE  WET PREP, GENITAL     Status: Abnormal   Collection Time    08/10/12  3:00 PM      Result Value Range   Yeast Wet Prep HPF POC NONE SEEN  NONE SEEN   Trich, Wet Prep NONE SEEN  NONE SEEN   Clue Cells Wet Prep HPF POC NONE SEEN  NONE SEEN   WBC, Wet Prep HPF POC MODERATE (*) NONE SEEN  FETAL FIBRONECTIN     Status: Abnormal   Collection Time    08/10/12  3:00 PM      Result Value Range   Fetal Fibronectin POSITIVE (*) NEGATIVE  CBC     Status: Abnormal   Collection Time    08/10/12  4:44 PM      Result Value Range   WBC 8.6  4.0 - 10.5 K/uL   RBC 3.54 (*) 3.87 - 5.11 MIL/uL   Hemoglobin 11.7  (*) 12.0 - 15.0 g/dL   HCT 16.1 (*) 09.6 - 04.5 %   MCV 92.7  78.0 - 100.0 fL   MCH 33.1  26.0 - 34.0 pg   MCHC 35.7  30.0 - 36.0 g/dL  RDW 12.9  11.5 - 15.5 %   Platelets 173  150 - 400 K/uL    Assessment and Plan  A:  [redacted]w[redacted]d   Di-Di twin IUP  Preterm uc's w/ +fFN  H/O PTB x 3, on 17P  H/O classical CS  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

## 2012-08-10 NOTE — Progress Notes (Signed)
Called to room by RN who states pt reports increased intensity of uc's and pressure.  S: Pt states she feels like uc's are coming more frequently and are more intense than when she first arrived, also more     pressure.   O: FHR reactive and reassuring x 2      UCs q 5-8      Magnesium @ 2gm/hr      Has received 1st dose of BMZ      SVE: no change  A:  Continued preterm uc's despite 4g loading dose and 2g/hr magnesium sulfate  P:  Notified dr. Jolayne Panther who states to increase magnesium to 3g/hr  Marge Duncans  08/10/12 @ 2035

## 2012-08-10 NOTE — MAU Note (Signed)
Pt states that she started having some abdominal cramping last night and called the nurse line and after talking with the call nurse she told her that she would come in today if the cramping got worse. Denies any bleeding and LOF.

## 2012-08-10 NOTE — MAU Provider Note (Signed)
History     CSN: [id number]  Arrival date and time: 08/10/12 1405   None     Chief Complaint  Patient presents with  . Abdominal Cramping   HPI Laurie Hall is a 27 y.o. [id number] female [redacted]w[redacted]d by 7wk u/s, who presents w/ report of losing mucous plug yesterday, then began to cramp later on last night, and has gotten worse throughout the day today- feels q 5-7mins, now also has some pelvic pressure. Reports good fm x 2. Denies lof or vb. Denies urinary frequency, hesitancy, urgency, or dysuria.  Denies abnormal or malodorous vaginal d/c or vulvovaginal irritation. No sex or anything in vagina in last 24 hours.   H/O spontaneous PTB x 3 @ 25, 36, and 30wks, has been receiving 17P during this pregnancy, next shot on Thurs, next visit on 6/26. Last CL @ 26.1wks 3.4cm. Early 1hr glucola 121, repeat 106. Quad screen normal, NIPT low r/f aneuploidy. 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 H/o classical c/s w/ 30wk delivery d/t malpresentation/labor/rom. Did have one term SVD @ 39wks after 25wk delivery.   OB History   Grav Para Term Preterm Abortions TAB SAB Ect Mult Living   7 4 1 3 2 1 1 0 0 3      Past Medical History  Diagnosis Date  . Urinary tract infection   . Meningitis   . Ovarian cyst   . Abnormal Pap smear     x4, repeats ok    Past Surgical History  Procedure Laterality Date  . Cesarean section    . Therapeutic abortion      Family History  Problem Relation Age of Onset  . Other Neg Hx   . Hyperthyroidism Mother   . Heart disease Mother   . Diabetes Mother   . Hyperthyroidism Maternal Aunt   . Hypothyroidism Maternal Grandmother   . Heart murmur Maternal Grandmother   . Diabetes Maternal Grandmother   . Hyperthyroidism Maternal Grandfather   . Heart disease Maternal Grandfather     History  Substance Use Topics  . Smoking status: Current Every Day Smoker -- 0.25 packs/day for 8 years    Types: Cigarettes  . Smokeless tobacco: Never Used  .  Alcohol Use: No     Comment: social    Allergies: No Known Allergies  Facility-administered medications prior to admission  Medication Dose Route Frequency Provider Last Rate Last Dose  . hydroxyprogesterone caproate (DELALUTIN) 250 mg/mL injection 250 mg  250 mg Intramuscular Weekly Peggy Constant, MD   250 mg at 08/08/12 1041   Prescriptions prior to admission  Medication Sig Dispense Refill  . Prenatal Vit-Fe Fumarate-FA (PRENATAL MULTIVITAMIN) TABS Take 1 tablet by mouth at bedtime.        Review of Systems  Constitutional: Negative.   HENT: Negative.   Eyes: Negative.   Respiratory: Negative.   Cardiovascular: Negative.   Gastrointestinal: Positive for abdominal pain (pelvic cramping).  Genitourinary: Negative.   Musculoskeletal: Negative.   Skin: Negative.   Neurological: Negative.   Endo/Heme/Allergies: Negative.   Psychiatric/Behavioral: Negative.    Physical Exam   Blood pressure 107/62, pulse 91, temperature 97.8 F (36.6 C), temperature source Oral, resp. rate 18, last menstrual period 12/12/2011.  Physical Exam  Constitutional: She is oriented to person, place, and time. She appears well-developed and well-nourished.  HENT:  Head: Normocephalic.  Neck: Normal range of motion.  Cardiovascular: Normal rate.   Respiratory: Effort normal.  GI: Soft.  gravid  Genitourinary: Vagina normal   and uterus normal.  Spec exam: small amount thin white creamy nonodorous d/c, cx visually closed. FFN, wet prep, gc/ch, and gbs obtained.   SVE: outer os 1, unable to reach inner os/th/-3  Musculoskeletal: Normal range of motion.  Neurological: She is alert and oriented to person, place, and time. She has normal reflexes.  Skin: Skin is warm and dry.  Psychiatric: She has a normal mood and affect. Her behavior is normal. Judgment and thought content normal.   FHR A: 135, mod variability, +accels, no decels=Cat I FHR B: 145, mod variability, +accels, no decels=Cat I UCs: 2  uc's w/ ui  MAU Course  Procedures  EFM UA Spec exam w/ fFN, wet prep, gc/ch, gbs collected but not sent yet  SVE  1525: Notified Dr. Constant of pt, will do IV hydration and procardia if needed, w/ repeat SVE 1625: Has received 1L LR bolus, states cramping is the same, not as much uterine activity noted on EFM, SVE: no change, fFN+.            Notified Dr. Constant. To admit pt, magnesium x 12hr, and BMZ x 2  Results for orders placed during the hospital encounter of 08/10/12 (from the past 24 hour(s))  URINALYSIS, ROUTINE W REFLEX MICROSCOPIC     Status: Abnormal   Collection Time    08/10/12  2:15 PM      Result Value Range   Color, Urine YELLOW  YELLOW   APPearance CLEAR  CLEAR   Specific Gravity, Urine 1.010  1.005 - 1.030   pH 6.5  5.0 - 8.0   Glucose, UA NEGATIVE  NEGATIVE mg/dL   Hgb urine dipstick NEGATIVE  NEGATIVE   Bilirubin Urine NEGATIVE  NEGATIVE   Ketones, ur NEGATIVE  NEGATIVE mg/dL   Protein, ur NEGATIVE  NEGATIVE mg/dL   Urobilinogen, UA 0.2  0.0 - 1.0 mg/dL   Nitrite NEGATIVE  NEGATIVE   Leukocytes, UA SMALL (*) NEGATIVE  URINE MICROSCOPIC-ADD ON     Status: Abnormal   Collection Time    08/10/12  2:15 PM      Result Value Range   Squamous Epithelial / LPF FEW (*) RARE   WBC, UA 7-10  <3 WBC/hpf   RBC / HPF 0-2  <3 RBC/hpf   Bacteria, UA FEW (*) RARE  WET PREP, GENITAL     Status: Abnormal   Collection Time    08/10/12  3:00 PM      Result Value Range   Yeast Wet Prep HPF POC NONE SEEN  NONE SEEN   Trich, Wet Prep NONE SEEN  NONE SEEN   Clue Cells Wet Prep HPF POC NONE SEEN  NONE SEEN   WBC, Wet Prep HPF POC MODERATE (*) NONE SEEN  FETAL FIBRONECTIN     Status: Abnormal   Collection Time    08/10/12  3:00 PM      Result Value Range   Fetal Fibronectin POSITIVE (*) NEGATIVE  CBC     Status: Abnormal   Collection Time    08/10/12  4:44 PM      Result Value Range   WBC 8.6  4.0 - 10.5 K/uL   RBC 3.54 (*) 3.87 - 5.11 MIL/uL   Hemoglobin 11.7  (*) 12.0 - 15.0 g/dL   HCT 32.8 (*) 36.0 - 46.0 %   MCV 92.7  78.0 - 100.0 fL   MCH 33.1  26.0 - 34.0 pg   MCHC 35.7  30.0 - 36.0 g/dL     RDW 12.9  11.5 - 15.5 %   Platelets 173  150 - 400 K/uL    Assessment and Plan  A:  [redacted]w[redacted]d   Di-Di twin IUP  Preterm uc's w/ +fFN  H/O PTB x 3, on 17P  H/O classical CS  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    

## 2012-08-11 DIAGNOSIS — O09219 Supervision of pregnancy with history of pre-term labor, unspecified trimester: Secondary | ICD-10-CM

## 2012-08-11 LAB — URINE CULTURE: Colony Count: 7000

## 2012-08-11 MED ORDER — MAGNESIUM SULFATE 40 G IN LACTATED RINGERS - SIMPLE
2.0000 g/h | INTRAVENOUS | Status: DC
  Administered 2012-08-11: 2 g/h via INTRAVENOUS
  Filled 2012-08-11: qty 500

## 2012-08-11 MED ORDER — DIPHENHYDRAMINE HCL 25 MG PO CAPS
25.0000 mg | ORAL_CAPSULE | Freq: Once | ORAL | Status: AC
  Administered 2012-08-11: 25 mg via ORAL
  Filled 2012-08-11: qty 1

## 2012-08-11 NOTE — H&P (Signed)
Attestation of Attending Supervision of Advanced Practitioner (CNM/NP): Evaluation and management procedures were performed by the Advanced Practitioner under my supervision and collaboration.  I have reviewed the Advanced Practitioner's note and chart, and I agree with the management and plan.  Mina Carlisi 08/11/2012 7:44 AM

## 2012-08-11 NOTE — Progress Notes (Signed)
RN still at bedside adjusting cardio A and B

## 2012-08-11 NOTE — Progress Notes (Signed)
Patient ID: Laurie Hall, female   DOB: 1985-07-19, 27 y.o.   MRN: [id number] FACULTY PRACTICE ANTEPARTUM(COMPREHENSIVE) NOTE  LORELI DEBRULER is a 27 y.o. [id number] at [redacted]w[redacted]d  who is admitted for Preterm labor.    Fetal presentation is unsure. Length of Stay:  1  Days  Date of admission:08/10/2012  Subjective: Patient reports improvement in her contractions since admission Patient reports the fetal movement as active. Patient reports uterine contraction  activity as none. Patient reports  vaginal bleeding as none. Patient describes fluid per vagina as None.  Vitals:  Blood pressure 120/64, pulse 93, temperature 98.2 F (36.8 C), temperature source Oral, resp. rate 18, height 5\' 9"  (1.753 m), weight 175 lb (79.379 kg), last menstrual period 12/12/2011, SpO2 100.00%. Filed Vitals:   08/11/12 0200 08/11/12 0301 08/11/12 0401 08/11/12 0435  BP: 86/47 116/93 120/64   Pulse: 91 103 93   Temp: 98.2 F (36.8 C)     TempSrc: Oral     Resp: 20 18 18 18   Height:      Weight:      SpO2:       Physical Examination:  General appearance - alert, well appearing, and in no distress Fundal Height:  consistent with twins Pelvic Exam:  examination not indicated Cervical Exam: Not evaluated.  Extremities: no edema, redness or tenderness in the calves or thighs with DTRs 2+ bilaterally Membranes:intact  Fetal Monitoring:  Baseline: 120/130 bpm   Appropriate for gestational age Toco: irregular contractions every 20-30 minutes  Labs:  Results for orders placed during the hospital encounter of 08/10/12 (from the past 24 hour(s))  URINALYSIS, ROUTINE W REFLEX MICROSCOPIC   Collection Time    08/10/12  2:15 PM      Result Value Range   Color, Urine YELLOW  YELLOW   APPearance CLEAR  CLEAR   Specific Gravity, Urine 1.010  1.005 - 1.030   pH 6.5  5.0 - 8.0   Glucose, UA NEGATIVE  NEGATIVE mg/dL   Hgb urine dipstick NEGATIVE  NEGATIVE   Bilirubin Urine NEGATIVE  NEGATIVE   Ketones, ur  NEGATIVE  NEGATIVE mg/dL   Protein, ur NEGATIVE  NEGATIVE mg/dL   Urobilinogen, UA 0.2  0.0 - 1.0 mg/dL   Nitrite NEGATIVE  NEGATIVE   Leukocytes, UA SMALL (*) NEGATIVE  URINE MICROSCOPIC-ADD ON   Collection Time    08/10/12  2:15 PM      Result Value Range   Squamous Epithelial / LPF FEW (*) RARE   WBC, UA 7-10  <3 WBC/hpf   RBC / HPF 0-2  <3 RBC/hpf   Bacteria, UA FEW (*) RARE  WET PREP, GENITAL   Collection Time    08/10/12  3:00 PM      Result Value Range   Yeast Wet Prep HPF POC NONE SEEN  NONE SEEN   Trich, Wet Prep NONE SEEN  NONE SEEN   Clue Cells Wet Prep HPF POC NONE SEEN  NONE SEEN   WBC, Wet Prep HPF POC MODERATE (*) NONE SEEN  FETAL FIBRONECTIN   Collection Time    08/10/12  3:00 PM      Result Value Range   Fetal Fibronectin POSITIVE (*) NEGATIVE  CBC   Collection Time    08/10/12  4:44 PM      Result Value Range   WBC 8.6  4.0 - 10.5 K/uL   RBC 3.54 (*) 3.87 - 5.11 MIL/uL   Hemoglobin 11.7 (*) 12.0 - 15.0 g/dL  HCT 32.8 (*) 36.0 - 46.0 %   MCV 92.7  78.0 - 100.0 fL   MCH 33.1  26.0 - 34.0 pg   MCHC 35.7  30.0 - 36.0 g/dL   RDW 16.1  09.6 - 04.5 %   Platelets 173  150 - 400 K/uL  RPR   Collection Time    08/10/12  4:44 PM      Result Value Range   RPR NON REACTIVE  NON REACTIVE    Imaging Studies:    Currently EPIC will not allow sonographic studies to automatically populate into notes.  In the meantime, copy and paste results into note or free text.  Medications:  Scheduled . betamethasone acetate-betamethasone sodium phosphate  12 mg Intramuscular Q24H  . docusate sodium  100 mg Oral Daily  . [START ON 08/15/2012] hydroxyprogesterone caproate  250 mg Intramuscular Weekly  . prenatal multivitamin  1 tablet Oral Q1200   I have reviewed the patient's current medications.  ASSESSMENT: Patient Active Problem List   Diagnosis Date Noted  . Suspected fetal anomaly, antepartum-Ventriculomegaly in Twin B 05/23/2012  . Rubella non-immune status,  antepartum 05/09/2012  . Subclinical hyperthyroidism 05/08/2012  . Twin pregnancy, antepartum 05/02/2012  . Previous cesarean delivery, antepartum condition or complication 05/02/2012  . Pregnancy with history of pre-term labor 05/02/2012  . Abnormal weight loss 05/02/2012  . TOBACCO DEPENDENCE 04/26/2006    PLAN: 27 yo [id number] at [redacted]w[redacted]d admitted with preterm contractions and positive FFN - fetal status reassuring x 2 - Continue tocolysis - Patient to complete second dose of betamethasone today - continue current care   Joash Tony 08/11/2012,6:39 AM

## 2012-08-11 NOTE — MAU Provider Note (Signed)
Attestation of Attending Supervision of Advanced Practitioner (CNM/NP): Evaluation and management procedures were performed by the Advanced Practitioner under my supervision and collaboration.  I have reviewed the Advanced Practitioner's note and chart, and I agree with the management and plan.  Ryin Ambrosius 08/11/2012 7:44 AM   

## 2012-08-12 DIAGNOSIS — O358XX0 Maternal care for other (suspected) fetal abnormality and damage, not applicable or unspecified: Secondary | ICD-10-CM

## 2012-08-12 DIAGNOSIS — O479 False labor, unspecified: Secondary | ICD-10-CM

## 2012-08-12 DIAGNOSIS — O30009 Twin pregnancy, unspecified number of placenta and unspecified number of amniotic sacs, unspecified trimester: Secondary | ICD-10-CM

## 2012-08-12 DIAGNOSIS — O34219 Maternal care for unspecified type scar from previous cesarean delivery: Secondary | ICD-10-CM

## 2012-08-12 LAB — GC/CHLAMYDIA PROBE AMP: GC Probe RNA: NEGATIVE

## 2012-08-12 MED ORDER — NIFEDIPINE ER 30 MG PO TB24
30.0000 mg | ORAL_TABLET | Freq: Two times a day (BID) | ORAL | Status: DC
  Administered 2012-08-12 – 2012-08-13 (×3): 30 mg via ORAL
  Filled 2012-08-12 (×4): qty 1

## 2012-08-12 MED ORDER — CYCLOBENZAPRINE HCL 10 MG PO TABS
10.0000 mg | ORAL_TABLET | Freq: Three times a day (TID) | ORAL | Status: DC | PRN
  Administered 2012-08-12: 10 mg via ORAL
  Filled 2012-08-12: qty 1

## 2012-08-12 NOTE — Progress Notes (Signed)
RN continue to readjuste, FHR tracing very close

## 2012-08-12 NOTE — Progress Notes (Signed)
UR completed 

## 2012-08-12 NOTE — Progress Notes (Signed)
Patient ID: Laurie Hall, female   DOB: [birth year], 27 y.o.   MRN: [id number] FACULTY PRACTICE ANTEPARTUM(COMPREHENSIVE) NOTE  Laurie Hall is a 26 y.o. [id number] at [redacted]w[redacted]d  who is admitted for Preterm labor.    Fetal presentation is unsure. Length of Stay:  2  Days  Date of admission:08/10/2012  Subjective: Patient reports rare contractions overnight. Reports muscle spasm and hip pain. Patient reports the fetal movement as active. Patient reports  vaginal bleeding as none. Patient describes fluid per vagina as None.  Vitals:  Blood pressure 107/48, pulse 96, temperature 98.9 F (37.2 C), temperature source Oral, resp. rate 20, height 5\' 9"  (1.753 m), weight 175 lb (79.379 kg), last menstrual period 12/12/2011, SpO2 98.00%. Filed Vitals:   08/12/12 0410 08/12/12 0511 08/12/12 0600 08/12/12 0717  BP: 107/48     Pulse: 96     Temp:      TempSrc:      Resp: 20 20 20 20   Height:      Weight:      SpO2:       Physical Examination:  General appearance - alert, well appearing, and in no distress Fundal Height:  consistent with twins Pelvic Exam:  examination not indicated Cervical Exam: Not evaluated.  Extremities: no edema, redness or tenderness in the calves or thighs with DTRs 2+ bilaterally Membranes:intact  Fetal Monitoring:  Baseline: 140 bpm, moderate variability, + accelerations, no decelerations  X 2  Toco:2-4 per hour  Labs:  No results found for this or any previous visit (from the past 24 hour(s)).  Medications:  Scheduled . docusate sodium  100 mg Oral Daily  . [START ON 08/15/2012] hydroxyprogesterone caproate  250 mg Intramuscular Weekly  . prenatal multivitamin  1 tablet Oral Q1200   I have reviewed the patient's current medications.  ASSESSMENT: Patient Active Problem List   Diagnosis Date Noted  . Suspected fetal anomaly, antepartum-Ventriculomegaly in Twin B 05/23/2012  . Rubella non-immune status, antepartum 05/09/2012  . Subclinical  hyperthyroidism 05/08/2012  . Twin pregnancy, antepartum 05/02/2012  . Previous cesarean delivery, antepartum condition or complication 05/02/2012  . Pregnancy with history of pre-term labor 05/02/2012  . Abnormal weight loss 05/02/2012  . TOBACCO DEPENDENCE 04/26/2006    PLAN: 27 yo [id number] at [redacted]w[redacted]d admitted with preterm contractions and positive FFN - fetal status reassuring x 2; s/p betamethasone x 2 - Switch tocolysis to Procardia - Flexeril ordered for muscle spasm - Continue routine antenatal care, may be discharged later today if remains stable  [name] A 08/12/2012,7:53 AM

## 2012-08-12 NOTE — Progress Notes (Signed)
Sleeping in left lateral position.  TOCO intact.  No signs of distress.  HOB elevated x15 degrees.

## 2012-08-12 NOTE — Progress Notes (Signed)
Mag. Sulfate discontinued .  Pt. Receiving procardia 30 mg. ER this a.m.

## 2012-08-12 NOTE — Progress Notes (Signed)
C/o abd. Pain that feels like a contraction.  Has been ordered flexeril.  Awaiting arrival of med.  Encouraged pt. To drink fluids.

## 2012-08-12 NOTE — Progress Notes (Signed)
Pt off the monitor after reassurring FHR  

## 2012-08-12 NOTE — Progress Notes (Signed)
RN continue at bedside readjusting cardio A and B

## 2012-08-13 DIAGNOSIS — O4700 False labor before 37 completed weeks of gestation, unspecified trimester: Secondary | ICD-10-CM | POA: Diagnosis present

## 2012-08-13 LAB — CULTURE, BETA STREP (GROUP B ONLY)

## 2012-08-13 MED ORDER — NIFEDIPINE ER 30 MG PO TB24: 30.0000 mg | ORAL_TABLET | Freq: Two times a day (BID) | ORAL | Status: DC

## 2012-08-13 MED ORDER — DSS 100 MG PO CAPS: 100.0000 mg | ORAL_CAPSULE | Freq: Every day | ORAL | Status: DC

## 2012-08-13 MED ORDER — CYCLOBENZAPRINE HCL 10 MG PO TABS: 10.0000 mg | ORAL_TABLET | Freq: Three times a day (TID) | ORAL | Status: DC | PRN

## 2012-08-13 NOTE — Progress Notes (Signed)
Pt. Ready to be discharged. All discharge instructions given to pt. And she verbalized she understood. All questions answered. All belongings are with the pt. Pt. Went home with her father and wheeled out to the front.

## 2012-08-13 NOTE — Discharge Summary (Signed)
Antenatal Physician Discharge Summary  Patient ID: Laurie Hall MRN: [id number] DOB/AGE: 1985-04-21 27 y.o.  Admit date: 08/10/2012 Discharge date: 08/13/2012  Admission Diagnoses:  Patient Active Problem List   Diagnosis Date Noted  . Twin pregnancy, antepartum 05/02/2012    Priority: High  . Suspected fetal anomaly, antepartum-Ventriculomegaly in Twin B 05/23/2012    Priority: Medium  . Rubella non-immune status, antepartum 05/09/2012    Priority: Medium  . Subclinical hyperthyroidism 05/08/2012    Priority: Medium  . Previous cesarean delivery, antepartum condition or complication 05/02/2012    Priority: Medium  . Pregnancy with history of pre-term labor 05/02/2012    Priority: Medium  . Threatened preterm labor, antepartum 08/13/2012  . Abnormal weight loss 05/02/2012  . TOBACCO DEPENDENCE 04/26/2006     Discharge Diagnoses: Same  Prenatal Procedures:  Magnesium Sulfate tocolysis, Betamethasone  Significant Diagnostic Studies:  Results for orders placed during the hospital encounter of 08/10/12 (from the past 168 hour(s))  URINE CULTURE   Collection Time    08/10/12  2:15 PM      Result Value Range   Specimen Description URINE, CLEAN CATCH     Special Requests NONE     Culture  Setup Time 08/10/2012 20:42     Colony Count 7,000 COLONIES/ML     Culture INSIGNIFICANT GROWTH     Report Status 08/11/2012 FINAL    URINALYSIS, ROUTINE W REFLEX MICROSCOPIC   Collection Time    08/10/12  2:15 PM      Result Value Range   Color, Urine YELLOW  YELLOW   APPearance CLEAR  CLEAR   Specific Gravity, Urine 1.010  1.005 - 1.030   pH 6.5  5.0 - 8.0   Glucose, UA NEGATIVE  NEGATIVE mg/dL   Hgb urine dipstick NEGATIVE  NEGATIVE   Bilirubin Urine NEGATIVE  NEGATIVE   Ketones, ur NEGATIVE  NEGATIVE mg/dL   Protein, ur NEGATIVE  NEGATIVE mg/dL   Urobilinogen, UA 0.2  0.0 - 1.0 mg/dL   Nitrite NEGATIVE  NEGATIVE   Leukocytes, UA SMALL (*) NEGATIVE  URINE  MICROSCOPIC-ADD ON   Collection Time    08/10/12  2:15 PM      Result Value Range   Squamous Epithelial / LPF FEW (*) RARE   WBC, UA 7-10  <3 WBC/hpf   RBC / HPF 0-2  <3 RBC/hpf   Bacteria, UA FEW (*) RARE  WET PREP, GENITAL   Collection Time    08/10/12  3:00 PM      Result Value Range   Yeast Wet Prep HPF POC NONE SEEN  NONE SEEN   Trich, Wet Prep NONE SEEN  NONE SEEN   Clue Cells Wet Prep HPF POC NONE SEEN  NONE SEEN   WBC, Wet Prep HPF POC MODERATE (*) NONE SEEN  GC/CHLAMYDIA PROBE AMP   Collection Time    08/10/12  3:00 PM      Result Value Range   CT Probe RNA NEGATIVE  NEGATIVE   GC Probe RNA NEGATIVE  NEGATIVE  CULTURE, BETA STREP (GROUP B ONLY)   Collection Time    08/10/12  3:00 PM      Result Value Range   Specimen Description VAGINAL/RECTAL     Special Requests NONE     Culture Culture reincubated for better growth     Report Status PENDING    FETAL FIBRONECTIN   Collection Time    08/10/12  3:00 PM      Result Value Range  Fetal Fibronectin POSITIVE (*) NEGATIVE  CBC   Collection Time    08/10/12  4:44 PM      Result Value Range   WBC 8.6  4.0 - 10.5 K/uL   RBC 3.54 (*) 3.87 - 5.11 MIL/uL   Hemoglobin 11.7 (*) 12.0 - 15.0 g/dL   HCT 40.9 (*) 81.1 - 91.4 %   MCV 92.7  78.0 - 100.0 fL   MCH 33.1  26.0 - 34.0 pg   MCHC 35.7  30.0 - 36.0 g/dL   RDW 78.2  95.6 - 21.3 %   Platelets 173  150 - 400 K/uL  RPR   Collection Time    08/10/12  4:44 PM      Result Value Range   RPR NON REACTIVE  NON REACTIVE  TYPE AND SCREEN   Collection Time    08/10/12  4:44 PM      Result Value Range   ABO/RH(D) AB NEG     Antibody Screen NEG     Sample Expiration 08/13/2012      Treatments: IV hydration, steroids: betamethasone and Magnesium Sulfate.  Hospital Course:  This is a 27 y.o. [id number] with IUP at [redacted]w[redacted]d admitted for Threatened PTL. She was admitted with contractions, noted to have a cervical exam of 1/th/-3.  No leaking of fluid and no bleeding.  She was  initially started on magnesium sulfate for tocolysis and neuroprotection and also received betamethasone x 2 doses.  Her tocolysis was transitioned to Procardia. She was seen by Neonatology during her stay.  She was observed, fetal heart rate monitoring remained reassuring, and she had no signs/symptoms of progressing preterm labor or other maternal-fetal concerns.  Her cervical exam was unchanged from admission.  She was deemed stable for discharge to home with outpatient follow up.  Discharge Exam: BP 117/61  Pulse 92  Temp(Src) 98.4 F (36.9 C) (Oral)  Resp 18  Ht 5\' 9"  (1.753 m)  Wt 175 lb (79.379 kg)  BMI 25.83 kg/m2  SpO2 96%  LMP 12/12/2011 General appearance: alert, cooperative and appears stated age GI: soft, non-tender; bowel sounds normal; no masses,  no organomegaly Pelvic: cervix is long/ closed, posterior and high.  I could not get one finger all the way through. Extremities: extremities normal, atraumatic, no cyanosis or edema  Discharge Condition: improved  Disposition: 01-Home or Self Care  Discharge Orders   Future Appointments Provider Department Dept Phone   08/15/2012 9:30 AM [facility] [phone]   08/15/2012 10:45 AM Reva Bores, MD [facility] [phone]   Future Orders Complete By Expires     Discharge activity:  Bathroom / Shower only  As directed     Discharge activity: Bedrest  As directed     Discharge diet:  No restrictions  As directed     Do not have sex or do anything that might make you have an orgasm  As directed     Fetal Kick Count:  Lie on our left side for one hour after a meal, and count the number of times your baby kicks.  If it is less than 5 times, get up, move around and drink some juice.  Repeat the test 30 minutes later.  If it is still less than 5 kicks in an hour, notify your doctor.  As directed     Notify physician for a general feeling that "something is not right"  As  directed  Notify physician for increase or change in vaginal discharge  As directed     Notify physician for intestinal cramps, with or without diarrhea, sometimes described as "gas pain"  As directed     Notify physician for leaking of fluid  As directed     Notify physician for low, dull backache, unrelieved by heat or Tylenol  As directed     Notify physician for menstrual like cramps  As directed     Notify physician for pelvic pressure  As directed     Notify physician for uterine contractions.  These may be painless and feel like the uterus is tightening or the baby is  "balling up"  As directed     Notify physician for vaginal bleeding  As directed     PRETERM LABOR:  Includes any of the follwing symptoms that occur between 20 - [redacted] weeks gestation.  If these symptoms are not stopped, preterm labor can result in preterm delivery, placing your baby at risk  As directed         Medication List    TAKE these medications       cyclobenzaprine 10 MG tablet  Commonly known as:  FLEXERIL  Take 1 tablet (10 mg total) by mouth 3 (three) times daily as needed for muscle spasms.     DSS 100 MG Caps  Take 100 mg by mouth daily.     NIFEdipine 30 MG 24 hr tablet  Commonly known as:  PROCARDIA-XL/ADALAT CC  Take 1 tablet (30 mg total) by mouth 2 (two) times daily.     prenatal multivitamin Tabs  Take 1 tablet by mouth at bedtime.           Follow-up Information   Follow up with [facility]. (keep next scheduled appointment or make one within 1 wk)    Contact information:   [address] [place] [address] [phone]      Signed: Reva Bores M.D. 08/13/2012, 9:47 AM

## 2012-08-14 ENCOUNTER — Other Ambulatory Visit: Payer: Self-pay | Admitting: Family Medicine

## 2012-08-14 DIAGNOSIS — O30009 Twin pregnancy, unspecified number of placenta and unspecified number of amniotic sacs, unspecified trimester: Secondary | ICD-10-CM

## 2012-08-15 ENCOUNTER — Ambulatory Visit (INDEPENDENT_AMBULATORY_CARE_PROVIDER_SITE_OTHER): Payer: Medicaid Other | Admitting: Family Medicine

## 2012-08-15 ENCOUNTER — Ambulatory Visit (HOSPITAL_COMMUNITY)
Admission: RE | Admit: 2012-08-15 | Discharge: 2012-08-15 | Disposition: A | Discharge status: Home / Self Care | Payer: Medicaid Other | Source: Ambulatory Visit | Attending: Family Medicine | Admitting: Family Medicine

## 2012-08-15 VITALS — Temp 98.2°F | Wt 177.4 lb

## 2012-08-15 DIAGNOSIS — O30009 Twin pregnancy, unspecified number of placenta and unspecified number of amniotic sacs, unspecified trimester: Secondary | ICD-10-CM

## 2012-08-15 DIAGNOSIS — O09219 Supervision of pregnancy with history of pre-term labor, unspecified trimester: Secondary | ICD-10-CM

## 2012-08-15 DIAGNOSIS — O358XX0 Maternal care for other (suspected) fetal abnormality and damage, not applicable or unspecified: Secondary | ICD-10-CM

## 2012-08-15 DIAGNOSIS — O09212 Supervision of pregnancy with history of pre-term labor, second trimester: Secondary | ICD-10-CM

## 2012-08-15 DIAGNOSIS — O359XX2 Maternal care for (suspected) fetal abnormality and damage, unspecified, fetus 2: Secondary | ICD-10-CM

## 2012-08-15 DIAGNOSIS — O34219 Maternal care for unspecified type scar from previous cesarean delivery: Secondary | ICD-10-CM

## 2012-08-15 LAB — POCT URINALYSIS DIP (DEVICE)
Bilirubin Urine: NEGATIVE
Hgb urine dipstick: NEGATIVE
Nitrite: NEGATIVE
Urobilinogen, UA: 0.2 mg/dL (ref 0.0–1.0)
pH: 6.5 (ref 5.0–8.0)

## 2012-08-15 NOTE — Progress Notes (Signed)
A-vtx, 3 lb 6 oz, normal fluid B-trv 3 lb 2 oz, normal fluid, stable ventriculomegaly Has f/u with MFM. Admitted with PTL and d/c'd on Tuesday this week, continues to have cramping and not feeling well. Cervix is unchanged today + FFN.  Continue 17P today.

## 2012-08-15 NOTE — Progress Notes (Signed)
Pulse-   Edema-feet  Pain/pressure- "btwn legs and lower abd"

## 2012-08-15 NOTE — Patient Instructions (Signed)
Pregnancy - Third Trimester The third trimester of pregnancy (the last 3 months) is a period of the most rapid growth for you and your baby. The baby approaches a length of 20 inches and a weight of 6 to 10 pounds. The baby is adding on fat and getting ready for life outside your body. While inside, babies have periods of sleeping and waking, sucking thumbs, and hiccuping. You can often feel small contractions of the uterus. This is false labor. It is also called Braxton-Hicks contractions. This is like a practice for labor. The usual problems in this stage of pregnancy include more difficulty breathing, swelling of the hands and feet from water retention, and having to urinate more often because of the uterus and baby pressing on your bladder.  PRENATAL EXAMS  Blood work may continue to be done during prenatal exams. These tests are done to check on your health and the probable health of your baby. Blood work is used to follow your blood levels (hemoglobin). Anemia (low hemoglobin) is common during pregnancy. Iron and vitamins are given to help prevent this. You may also continue to be checked for diabetes. Some of the past blood tests may be done again.  The size of the uterus is measured during each visit. This makes sure your baby is growing properly according to your pregnancy dates.  Your blood pressure is checked every prenatal visit. This is to make sure you are not getting toxemia.  Your urine is checked every prenatal visit for infection, diabetes, and protein.  Your weight is checked at each visit. This is done to make sure gains are happening at the suggested rate and that you and your baby are growing normally.  Sometimes, an ultrasound is performed to confirm the position and the proper growth and development of the baby. This is a test done that bounces harmless sound waves off the baby so your caregiver can more accurately determine a due date.  Discuss the type of pain medicine and  anesthesia you will have during your labor and delivery.  Discuss the possibility and anesthesia if a cesarean section might be necessary.  Inform your caregiver if there is any mental or physical violence at home. Sometimes, a specialized non-stress test, contraction stress test, and biophysical profile are done to make sure the baby is not having a problem. Checking the amniotic fluid surrounding the baby is called an amniocentesis. The amniotic fluid is removed by sticking a needle into the belly (abdomen). This is sometimes done near the end of pregnancy if an early delivery is required. In this case, it is done to help make sure the baby's lungs are mature enough for the baby to live outside of the womb. If the lungs are not mature and it is unsafe to deliver the baby, an injection of cortisone medicine is given to the mother 1 to 2 days before the delivery. This helps the baby's lungs mature and makes it safer to deliver the baby. CHANGES OCCURING IN THE THIRD TRIMESTER OF PREGNANCY Your body goes through many changes during pregnancy. They vary from person to person. Talk to your caregiver about changes you notice and are concerned about.  During the last trimester, you have probably had an increase in your appetite. It is normal to have cravings for certain foods. This varies from person to person and pregnancy to pregnancy.  You may begin to get stretch marks on your hips, abdomen, and breasts. These are normal changes in the body   during pregnancy. There are no exercises or medicines to take which prevent this change.  Constipation may be treated with a stool softener or adding bulk to your diet. Drinking lots of fluids, fiber in vegetables, fruits, and whole grains are helpful.  Exercising is also helpful. If you have been very active up until your pregnancy, most of these activities can be continued during your pregnancy. If you have been less active, it is helpful to start an exercise  program such as walking. Consult your caregiver before starting exercise programs.  Avoid all smoking, alcohol, non-prescribed drugs, herbs and "street drugs" during your pregnancy. These chemicals affect the formation and growth of the baby. Avoid chemicals throughout the pregnancy to ensure the delivery of a healthy infant.  Backache, varicose veins, and hemorrhoids may develop or get worse.  You will tire more easily in the third trimester, which is normal.  The baby's movements may be stronger and more often.  You may become short of breath easily.  Your belly button may stick out.  A yellow discharge may leak from your breasts called colostrum.  You may have a bloody mucus discharge. This usually occurs a few days to a week before labor begins. HOME CARE INSTRUCTIONS   Keep your caregiver's appointments. Follow your caregiver's instructions regarding medicine use, exercise, and diet.  During pregnancy, you are providing food for you and your baby. Continue to eat regular, well-balanced meals. Choose foods such as meat, fish, milk and other low fat dairy products, vegetables, fruits, and whole-grain breads and cereals. Your caregiver will tell you of the ideal weight gain.  A physical sexual relationship may be continued throughout pregnancy if there are no other problems such as early (premature) leaking of amniotic fluid from the membranes, vaginal bleeding, or belly (abdominal) pain.  Exercise regularly if there are no restrictions. Check with your caregiver if you are unsure of the safety of your exercises. Greater weight gain will occur in the last 2 trimesters of pregnancy. Exercising helps:  Control your weight.  Get you in shape for labor and delivery.  You lose weight after you deliver.  Rest a lot with legs elevated, or as needed for leg cramps or low back pain.  Wear a good support or jogging bra for breast tenderness during pregnancy. This may help if worn during  sleep. Pads or tissues may be used in the bra if you are leaking colostrum.  Do not use hot tubs, steam rooms, or saunas.  Wear your seat belt when driving. This protects you and your baby if you are in an accident.  Avoid raw meat, cat litter boxes and soil used by cats. These carry germs that can cause birth defects in the baby.  It is easier to leak urine during pregnancy. Tightening up and strengthening the pelvic muscles will help with this problem. You can practice stopping your urination while you are going to the bathroom. These are the same muscles you need to strengthen. It is also the muscles you would use if you were trying to stop from passing gas. You can practice tightening these muscles up 10 times a set and repeating this about 3 times per day. Once you know what muscles to tighten up, do not perform these exercises during urination. It is more likely to cause an infection by backing up the urine.  Ask for help if you have financial, counseling, or nutritional needs during pregnancy. Your caregiver will be able to offer counseling for these   needs as well as refer you for other special needs.  Make a list of emergency phone numbers and have them available.  Plan on getting help from family or friends when you go home from the hospital.  Make a trial run to the hospital.  Take prenatal classes with the father to understand, practice, and ask questions about the labor and delivery.  Prepare the baby's room or nursery.  Do not travel out of the city unless it is absolutely necessary and with the advice of your caregiver.  Wear only low or no heal shoes to have better balance and prevent falling. MEDICINES AND DRUG USE IN PREGNANCY  Take prenatal vitamins as directed. The vitamin should contain 1 milligram of folic acid. Keep all vitamins out of reach of children. Only a couple vitamins or tablets containing iron may be fatal to a baby or young child when ingested.  Avoid use  of all medicines, including herbs, over-the-counter medicines, not prescribed or suggested by your caregiver. Only take over-the-counter or prescription medicines for pain, discomfort, or fever as directed by your caregiver. Do not use aspirin, ibuprofen or naproxen unless approved by your caregiver.  Let your caregiver also know about herbs you may be using.  Alcohol is related to a number of birth defects. This includes fetal alcohol syndrome. All alcohol, in any form, should be avoided completely. Smoking will cause low birth rate and premature babies.  Illegal drugs are very harmful to the baby. They are absolutely forbidden. A baby born to an addicted mother will be addicted at birth. The baby will go through the same withdrawal an adult does. SEEK MEDICAL CARE IF: You have any concerns or worries during your pregnancy. It is better to call with your questions if you feel they cannot wait, rather than worry about them. SEEK IMMEDIATE MEDICAL CARE IF:   An unexplained oral temperature above 102 F (38.9 C) develops, or as your caregiver suggests.  You have leaking of fluid from the vagina. If leaking membranes are suspected, take your temperature and tell your caregiver of this when you call.  There is vaginal spotting, bleeding or passing clots. Tell your caregiver of the amount and how many pads are used.  You develop a bad smelling vaginal discharge with a change in the color from clear to white.  You develop vomiting that lasts more than 24 hours.  You develop chills or fever.  You develop shortness of breath.  You develop burning on urination.  You loose more than 2 pounds of weight or gain more than 2 pounds of weight or as suggested by your caregiver.  You notice sudden swelling of your face, hands, and feet or legs.  You develop belly (abdominal) pain. Round ligament discomfort is a common non-cancerous (benign) cause of abdominal pain in pregnancy. Your caregiver still  must evaluate you.  You develop a severe headache that does not go away.  You develop visual problems, blurred or double vision.  If you have not felt your baby move for more than 1 hour. If you think the baby is not moving as much as usual, eat something with sugar in it and lie down on your left side for an hour. The baby should move at least 4 to 5 times per hour. Call right away if your baby moves less than that.  You fall, are in a car accident, or any kind of trauma.  There is mental or physical violence at home. Document Released: 02/07/2001   Document Revised: 11/08/2011 Document Reviewed: 08/12/2008 [employer] Patient Information 2014 [employer].  Breastfeeding A change in hormones during your pregnancy causes growth of your breast tissue and an increase in number and size of milk ducts. The hormone prolactin allows proteins, sugars, and fats from your blood supply to make breast milk in your milk-producing glands. The hormone progesterone prevents breast milk from being released before the birth of your baby. After the birth of your baby, your progesterone level decreases allowing breast milk to be released. Thoughts of your baby, as well as his or her sucking or crying, can stimulate the release of milk from the milk-producing glands. Deciding to breastfeed (nurse) is one of the best choices you can make for you and your baby. The information that follows gives a brief review of the benefits, as well as other important skills to know about breastfeeding. BENEFITS OF BREASTFEEDING For your baby  The first milk (colostrum) helps your baby's digestive system function better.   There are antibodies in your milk that help your baby fight off infections.   Your baby has a lower incidence of asthma, allergies, and sudden infant death syndrome (SIDS).   The nutrients in breast milk are better for your baby than infant formulas.  Breast milk improves your baby's brain development.    Your baby will have less gas, colic, and constipation.  Your baby is less likely to develop other conditions, such as childhood obesity, asthma, or diabetes mellitus. For you  Breastfeeding helps develop a very special bond between you and your baby.   Breastfeeding is convenient, always available at the correct temperature, and costs nothing.   Breastfeeding helps to burn calories and helps you lose the weight gained during pregnancy.   Breastfeeding makes your uterus contract back down to normal size faster and slows bleeding following delivery.   Breastfeeding mothers have a lower risk of developing osteoporosis or breast or ovarian cancer later in life.  BREASTFEEDING FREQUENCY  A healthy, full-term baby may breastfeed as often as every hour or space his or her feedings to every 3 hours. Breastfeeding frequency will vary from baby to baby.   Newborns should be fed no less than every 2 3 hours during the day and every 4 5 hours during the night. You should breastfeed a minimum of 8 feedings in a 24 hour period.  Awaken your baby to breastfeed if it has been 3 4 hours since the last feeding.  Breastfeed when you feel the need to reduce the fullness of your breasts or when your newborn shows signs of hunger. Signs that your baby may be hungry include:  Increased alertness or activity.  Stretching.  Movement of the head from side to side.  Movement of the head and opening of the mouth when the corner of the mouth or cheek is stroked (rooting).  Increased sucking sounds, smacking lips, cooing, sighing, or squeaking.  Hand-to-mouth movements.  Increased sucking of fingers or hands.  Fussing.  Intermittent crying.  Signs of extreme hunger will require calming and consoling before you try to feed your baby. Signs of extreme hunger may include:  Restlessness.  A loud, strong cry.  Screaming.  Frequent feeding will help you make more milk and will help prevent  problems, such as sore nipples and engorgement of the breasts.  BREASTFEEDING   Whether lying down or sitting, be sure that the baby's abdomen is facing your abdomen.   Support your breast with 4 fingers under your breast   and your thumb above your nipple. Make sure your fingers are well away from your nipple and your baby's mouth.   Stroke your baby's lips gently with your finger or nipple.   When your baby's mouth is open wide enough, place all of your nipple and as much of the colored area around your nipple (areola) as possible into your baby's mouth.  More areola should be visible above his or her upper lip than below his or her lower lip.  Your baby's tongue should be between his or her lower gum and your breast.  Ensure that your baby's mouth is correctly positioned around the nipple (latched). Your baby's lips should create a seal on your breast.  Signs that your baby has effectively latched onto your nipple include:  Tugging or sucking without pain.  Swallowing heard between sucks.  Absent click or smacking sound.  Muscle movement above and in front of his or her ears with sucking.  Your baby must suck about 2 3 minutes in order to get your milk. Allow your baby to feed on each breast as long as he or she wants. Nurse your baby until he or she unlatches or falls asleep at the first breast, then offer the second breast.  Signs that your baby is full and satisfied include:  A gradual decrease in the number of sucks or complete cessation of sucking.  Falling asleep.  Extension or relaxation of his or her body.  Retention of a small amount of milk in his or her mouth.  Letting go of your breast by himself or herself.  Signs of effective breastfeeding in you include:  Breasts that have increased firmness, weight, and size prior to feeding.  Breasts that are softer after nursing.  Increased milk volume, as well as a change in milk consistency and color by the 5th  day of breastfeeding.  Breast fullness relieved by breastfeeding.  Nipples are not sore, cracked, or bleeding.  If needed, break the suction by putting your finger into the corner of your baby's mouth and sliding your finger between his or her gums. Then, remove your breast from his or her mouth.  It is common for babies to spit up a small amount after a feeding.  Babies often swallow air during feeding. This can make babies fussy. Burping your baby between breasts can help with this.  Vitamin D supplements are recommended for babies who get only breast milk.  Avoid using a pacifier during your baby's first 4 6 weeks.  Avoid supplemental feedings of water, formula, or juice in place of breastfeeding. Breast milk is all the food your baby needs. It is not necessary for your baby to have water or formula. Your breasts will make more milk if supplemental feedings are avoided during the early weeks. HOW TO TELL WHETHER YOUR BABY IS GETTING ENOUGH BREAST MILK Wondering whether or not your baby is getting enough milk is a common concern among mothers. You can be assured that your baby is getting enough milk if:   Your baby is actively sucking and you hear swallowing.   Your baby seems relaxed and satisfied after a feeding.   Your baby nurses at least 8 12 times in a 24 hour time period.  During the first 3 5 days of age:  Your baby is wetting at least 3 5 diapers in a 24 hour period. The urine should be clear and pale yellow.  Your baby is having at least 3 4 stools in   a 24 hour period. The stool should be soft and yellow.  At 5 7 days of age, your baby is having at least 3 6 stools in a 24 hour period. The stool should be seedy and yellow by 5 days of age.  Your baby has a weight loss less than 7 10% during the first 3 days of age.  Your baby does not lose weight after 3 7 days of age.  Your baby gains 4 7 ounces each week after he or she is 4 days of age.  Your baby gains weight  by 5 days of age and is back to birth weight within 2 weeks. ENGORGEMENT In the first week after your baby is born, you may experience extremely full breasts (engorgement). When engorged, your breasts may feel heavy, warm, or tender to the touch. Engorgement peaks within 24 48 hours after delivery of your baby.  Engorgement may be reduced by:  Continuing to breastfeed.  Increasing the frequency of breastfeeding.  Taking warm showers or applying warm, moist heat to your breasts just before each feeding. This increases circulation and helps the milk flow.   Gently massaging your breast before and during the feedings. With your fingertips, massage from your chest wall towards your nipple in a circular motion.   Ensuring that your baby empties at least one breast at every feeding. It also helps to start the next feeding on the opposite breast.   Expressing breast milk by hand or by using a breast pump to empty the breasts if your baby is sleepy, or not nursing well. You may also want to express milk if you are returning to work oryou feel you are getting engorged.  Ensuring your baby is latched on and positioned properly while breastfeeding. If you follow these suggestions, your engorgement should improve in 24 48 hours. If you are still experiencing difficulty, call your lactation consultant or caregiver.  CARING FOR YOURSELF Take care of your breasts.  Bathe or shower daily.   Avoid using soap on your nipples.   Wear a supportive bra. Avoid wearing underwire style bras.  Air dry your nipples for a 3 4minutes after each feeding.   Use only cotton bra pads to absorb breast milk leakage. Leaking of breast milk between feedings is normal.   Use only pure lanolin on your nipples after nursing. You do not need to wash it off before feeding your baby again. Another option is to express a few drops of breast milk and gently massage that milk into your nipples.  Continue breast  self-awareness checks. Take care of yourself.  Eat healthy foods. Alternate 3 meals with 3 snacks.  Avoid foods that you notice affect your baby in a bad way.  Drink milk, fruit juice, and water to satisfy your thirst (about 8 glasses a day).   Rest often, relax, and take your prenatal vitamins to prevent fatigue, stress, and anemia.  Avoid chewing and smoking tobacco.  Avoid alcohol and drug use.  Take over-the-counter and prescribed medicine only as directed by your caregiver or pharmacist. You should always check with your caregiver or pharmacist before taking any new medicine, vitamin, or herbal supplement.  Know that pregnancy is possible while breastfeeding. If desired, talk to your caregiver about family planning and safe birth control methods that may be used while breastfeeding. SEEK MEDICAL CARE IF:   You feel like you want to stop breastfeeding or have become frustrated with breastfeeding.  You have painful breasts or nipples.    Your nipples are cracked or bleeding.  Your breasts are red, tender, or warm.  You have a swollen area on either breast.  You have a fever or chills.  You have nausea or vomiting.  You have drainage from your nipples.  Your breasts do not become full before feedings by the 5th day after delivery.  You feel sad and depressed.  Your baby is too sleepy to eat well.  Your baby is having trouble sleeping.   Your baby is wetting less than 3 diapers in a 24 hour period.  Your baby has less than 3 stools in a 24 hour period.  Your baby's skin or the white part of his or her eyes becomes more yellow.   Your baby is not gaining weight by 5 days of age. MAKE SURE YOU:   Understand these instructions.  Will watch your condition.  Will get help right away if you are not doing well or get worse. Document Released: 02/13/2005 Document Revised: 11/08/2011 Document Reviewed: 09/20/2011 [employer] Patient Information 2014 [employer].  

## 2012-08-22 ENCOUNTER — Ambulatory Visit (INDEPENDENT_AMBULATORY_CARE_PROVIDER_SITE_OTHER): Payer: Medicaid Other | Admitting: Obstetrics and Gynecology

## 2012-08-22 VITALS — BP 114/75 | HR 94 | Ht 69.0 in | Wt 173.0 lb

## 2012-08-22 DIAGNOSIS — O09219 Supervision of pregnancy with history of pre-term labor, unspecified trimester: Secondary | ICD-10-CM

## 2012-08-22 DIAGNOSIS — O09213 Supervision of pregnancy with history of pre-term labor, third trimester: Secondary | ICD-10-CM

## 2012-08-29 ENCOUNTER — Ambulatory Visit (INDEPENDENT_AMBULATORY_CARE_PROVIDER_SITE_OTHER): Payer: Medicaid Other | Admitting: Family Medicine

## 2012-08-29 VITALS — BP 107/75 | Wt 177.3 lb

## 2012-08-29 DIAGNOSIS — O9989 Other specified diseases and conditions complicating pregnancy, childbirth and the puerperium: Secondary | ICD-10-CM

## 2012-08-29 DIAGNOSIS — O26893 Other specified pregnancy related conditions, third trimester: Secondary | ICD-10-CM

## 2012-08-29 DIAGNOSIS — N898 Other specified noninflammatory disorders of vagina: Secondary | ICD-10-CM

## 2012-08-29 DIAGNOSIS — O09219 Supervision of pregnancy with history of pre-term labor, unspecified trimester: Secondary | ICD-10-CM

## 2012-08-29 DIAGNOSIS — O09213 Supervision of pregnancy with history of pre-term labor, third trimester: Secondary | ICD-10-CM

## 2012-08-29 LAB — POCT URINALYSIS DIP (DEVICE)
Ketones, ur: NEGATIVE mg/dL
Protein, ur: 30 mg/dL — AB
pH: 6.5 (ref 5.0–8.0)

## 2012-08-29 NOTE — Progress Notes (Signed)
Pulse: 104 Has pelvic pressure and pain.  Didn't start Procardia, she wasn't sure why she needed it. Needs RX for Prenatal Vitamins.

## 2012-08-29 NOTE — Progress Notes (Signed)
I saw and examined patient and reviewed medications, labs, allergies and imaging. I agree with student note below.

## 2012-08-29 NOTE — Progress Notes (Signed)
Feels vaginal pressure and cramping on left side for 1 week now.  No headaches, swelling or visual changes.  No vaginal bleeding but does have some vaginal discharge that is thicker than usual, no itching.   Has not been taking Flexeril or Procardia since hospital discharge.  Wet prep and GC done, suggested restarting procardia.  Cervix unchanged from previous visit.

## 2012-08-29 NOTE — Patient Instructions (Addendum)
Braxton Hicks Contractions Pregnancy is commonly associated with contractions of the uterus throughout the pregnancy. Towards the end of pregnancy (32 to 34 weeks), these contractions [facility] Willa Rough) can develop more often and may become more forceful. This is not true labor because these contractions do not result in opening (dilatation) and thinning of the cervix. They are sometimes difficult to tell apart from true labor because these contractions can be forceful and people have different pain tolerances. You should not feel embarrassed if you go to the hospital with false labor. Sometimes, the only way to tell if you are in true labor is for your caregiver to follow the changes in the cervix. How to tell the difference between true and false labor:  False labor.  The contractions of false labor are usually shorter, irregular and not as hard as those of true labor.  They are often felt in the front of the lower abdomen and in the groin.  They may leave with walking around or changing positions while lying down.  They get weaker and are shorter lasting as time goes on.  These contractions are usually irregular.  They do not usually become progressively stronger, regular and closer together as with true labor.  True labor.  Contractions in true labor last 30 to 70 seconds, become very regular, usually become more intense, and increase in frequency.  They do not go away with walking.  The discomfort is usually felt in the top of the uterus and spreads to the lower abdomen and low back.  True labor can be determined by your caregiver with an exam. This will show that the cervix is dilating and getting thinner. If there are no prenatal problems or other health problems associated with the pregnancy, it is completely safe to be sent home with false labor and await the onset of true labor. HOME CARE INSTRUCTIONS   Keep up with your usual exercises and instructions.  Take medications as  directed.  Keep your regular prenatal appointment.  Eat and drink lightly if you think you are going into labor.  If BH contractions are making you uncomfortable:  Change your activity position from lying down or resting to walking/walking to resting.  Sit and rest in a tub of warm water.  Drink 2 to 3 glasses of water. Dehydration may cause B-H contractions.  Do slow and deep breathing several times an hour. SEEK IMMEDIATE MEDICAL CARE IF:   Your contractions continue to become stronger, more regular, and closer together.  You have a gushing, burst or leaking of fluid from the vagina.  An oral temperature above 102 F (38.9 C) develops.  You have passage of blood-tinged mucus.  You develop vaginal bleeding.  You develop continuous belly (abdominal) pain.  You have low back pain that you never had before.  You feel the baby's head pushing down causing pelvic pressure.  The baby is not moving as much as it used to. Document Released: 02/13/2005 Document Revised: 05/08/2011 Document Reviewed: 08/07/2008 [facility] Patient Information 2014 [place], [place].  Vaginitis Vaginitis is an inflammation of the vagina. It is most often caused by a change in the normal balance of the bacteria and yeast that live in the vagina. This change in balance causes an overgrowth of certain bacteria or yeast, which causes the inflammation. There are different types of vaginitis, but the most common types are:  Bacterial vaginosis.  Yeast infection (candidiasis).  Trichomoniasis vaginitis. This is a sexually transmitted infection (STI).  Viral vaginitis.  Atropic vaginitis.  Allergic vaginitis. CAUSES  The cause depends on the type of vaginitis. Vaginitis can be caused by:  Bacteria (bacterial vaginosis).  Yeast (yeast infection).  A parasite (trichomoniasis vaginitis)  A virus (viral vaginitis).  Low hormone levels (atrophic vaginitis). Low hormone levels can occur during  pregnancy, breastfeeding, or after menopause.  Irritants, such as bubble baths, scented tampons, and feminine sprays (allergic vaginitis). Other factors can change the normal balance of the yeast and bacteria that live in the vagina. These include:  Antibiotic medicines.  Poor hygiene.  Diaphragms, vaginal sponges, spermicides, birth control pills, and intrauterine devices (IUD).  Sexual intercourse.  Infection.  Uncontrolled diabetes.  A weakened immune system. SYMPTOMS  Symptoms can vary depending on the cause of the vaginitis. Common symptoms include:  Abnormal vaginal discharge.  The discharge is white, gray, or yellow with bacterial vaginosis.  The discharge is thick, white, and cheesy with a yeast infection.  The discharge is frothy and yellow or greenish with trichomoniasis.  A bad vaginal odor.  The odor is fishy with bacterial vaginosis.  Vaginal itching, pain, or swelling.  Painful intercourse.  Pain or burning when urinating. Sometimes, there are no symptoms. TREATMENT  Treatment will vary depending on the type of infection.   Bacterial vaginosis and trichomoniasis are often treated with antibiotic creams or pills.  Yeast infections are often treated with antifungal medicines, such as vaginal creams or suppositories.  Viral vaginitis has no cure, but symptoms can be treated with medicines that relieve discomfort. Your sexual partner should be treated as well.  Atrophic vaginitis may be treated with an estrogen cream, pill, suppository, or vaginal ring. If vaginal dryness occurs, lubricants and moisturizing creams may help. You may be told to avoid scented soaps, sprays, or douches.  Allergic vaginitis treatment involves quitting the use of the product that is causing the problem. Vaginal creams can be used to treat the symptoms. HOME CARE INSTRUCTIONS   Take all medicines as directed by your caregiver.  Keep your genital area clean and dry. Avoid soap  and only rinse the area with water.  Avoid douching. It can remove the healthy bacteria in the vagina.  Do not use tampons or have sexual intercourse until your vaginitis has been treated. Use sanitary pads while you have vaginitis.  Wipe from front to back. This avoids the spread of bacteria from the rectum to the vagina.  Let air reach your genital area.  Wear cotton underwear to decrease moisture buildup.  Avoid wearing underwear while you sleep until your vaginitis is gone.  Avoid tight pants and underwear or nylons without a cotton panel.  Take off wet clothing (especially bathing suits) as soon as possible.  Use mild, non-scented products. Avoid using irritants, such as:  Scented feminine sprays.  Fabric softeners.  Scented detergents.  Scented tampons.  Scented soaps or bubble baths.  Practice safe sex and use condoms. Condoms may prevent the spread of trichomoniasis and viral vaginitis. SEEK MEDICAL CARE IF:   You have abdominal pain.  You have a fever or persistent symptoms for more than 2 3 days.  You have a fever and your symptoms suddenly get worse. Document Released: 12/11/2006 Document Revised: 11/08/2011 Document Reviewed: 07/27/2011 [facility] Patient Information 2014 [place], [place].

## 2012-08-30 LAB — GC/CHLAMYDIA PROBE AMP: GC Probe RNA: NEGATIVE

## 2012-08-30 LAB — WET PREP, GENITAL

## 2012-09-01 ENCOUNTER — Inpatient Hospital Stay (HOSPITAL_COMMUNITY): Payer: Medicaid Other | Admitting: Anesthesiology

## 2012-09-01 ENCOUNTER — Inpatient Hospital Stay (HOSPITAL_COMMUNITY)
Admission: AD | Admit: 2012-09-01 | Discharge: 2012-09-04 | DRG: 765 | Disposition: A | Discharge status: Home / Self Care | Payer: Medicaid Other | Source: Ambulatory Visit | Attending: Obstetrics & Gynecology | Admitting: Obstetrics & Gynecology

## 2012-09-01 ENCOUNTER — Encounter (HOSPITAL_COMMUNITY): Payer: Self-pay | Admitting: Anesthesiology

## 2012-09-01 ENCOUNTER — Encounter (HOSPITAL_COMMUNITY)
Admission: AD | Disposition: A | Discharge status: Home / Self Care | Payer: Self-pay | Source: Ambulatory Visit | Attending: Obstetrics & Gynecology

## 2012-09-01 ENCOUNTER — Encounter (HOSPITAL_COMMUNITY): Payer: Self-pay | Admitting: Family

## 2012-09-01 DIAGNOSIS — O30009 Twin pregnancy, unspecified number of placenta and unspecified number of amniotic sacs, unspecified trimester: Principal | ICD-10-CM | POA: Diagnosis present

## 2012-09-01 DIAGNOSIS — O30049 Twin pregnancy, dichorionic/diamniotic, unspecified trimester: Secondary | ICD-10-CM

## 2012-09-01 DIAGNOSIS — Z283 Underimmunization status: Secondary | ICD-10-CM

## 2012-09-01 DIAGNOSIS — O34219 Maternal care for unspecified type scar from previous cesarean delivery: Secondary | ICD-10-CM

## 2012-09-01 DIAGNOSIS — D649 Anemia, unspecified: Secondary | ICD-10-CM | POA: Diagnosis not present

## 2012-09-01 DIAGNOSIS — O9903 Anemia complicating the puerperium: Secondary | ICD-10-CM | POA: Diagnosis not present

## 2012-09-01 HISTORY — DX: Palpitations: R00.2

## 2012-09-01 LAB — URINALYSIS, ROUTINE W REFLEX MICROSCOPIC
Glucose, UA: NEGATIVE mg/dL
Leukocytes, UA: NEGATIVE
Nitrite: NEGATIVE
Protein, ur: NEGATIVE mg/dL
Urobilinogen, UA: 0.2 mg/dL (ref 0.0–1.0)

## 2012-09-01 LAB — CBC
HCT: 34.7 % — ABNORMAL LOW (ref 36.0–46.0)
HCT: 31.4 % — ABNORMAL LOW (ref 36.0–46.0)
Hemoglobin: 11.3 g/dL — ABNORMAL LOW (ref 12.0–15.0)
MCHC: 36 g/dL (ref 30.0–36.0)
MCHC: 36 g/dL (ref 30.0–36.0)
MCV: 90.8 fL (ref 78.0–100.0)
Platelets: 139 10*3/uL — ABNORMAL LOW (ref 150–400)
RDW: 13.3 % (ref 11.5–15.5)
RDW: 13.1 % (ref 11.5–15.5)
WBC: 8.4 10*3/uL (ref 4.0–10.5)

## 2012-09-01 LAB — FETAL FIBRONECTIN: Fetal Fibronectin: NEGATIVE

## 2012-09-01 LAB — PROTIME-INR
INR: 1.17 (ref 0.00–1.49)
Prothrombin Time: 14.7 seconds (ref 11.6–15.2)

## 2012-09-01 SURGERY — Surgical Case
Anesthesia: Spinal | Site: Abdomen | Wound class: Clean Contaminated

## 2012-09-01 MED ORDER — METOCLOPRAMIDE HCL 5 MG/ML IJ SOLN
10.0000 mg | Freq: Three times a day (TID) | INTRAMUSCULAR | Status: DC | PRN
Start: 2012-09-01 — End: 2012-09-04

## 2012-09-01 MED ORDER — TETANUS-DIPHTH-ACELL PERTUSSIS 5-2.5-18.5 LF-MCG/0.5 IM SUSP
0.5000 mL | Freq: Once | INTRAMUSCULAR | Status: AC
  Administered 2012-09-04: 0.5 mL via INTRAMUSCULAR
  Filled 2012-09-01: qty 0.5

## 2012-09-01 MED ORDER — CITRIC ACID-SODIUM CITRATE 334-500 MG/5ML PO SOLN
ORAL | Status: AC
  Administered 2012-09-01: 30 mL
  Filled 2012-09-01: qty 15

## 2012-09-01 MED ORDER — OXYCODONE-ACETAMINOPHEN 5-325 MG PO TABS
1.0000 | ORAL_TABLET | ORAL | Status: DC | PRN
  Administered 2012-09-03 – 2012-09-04 (×3): 1 via ORAL
  Filled 2012-09-01: qty 2
  Filled 2012-09-01 (×3): qty 1

## 2012-09-01 MED ORDER — NALBUPHINE HCL 10 MG/ML IJ SOLN
5.0000 mg | INTRAMUSCULAR | Status: DC | PRN
  Filled 2012-09-01: qty 1

## 2012-09-01 MED ORDER — ZOLPIDEM TARTRATE 5 MG PO TABS: 5.0000 mg | ORAL_TABLET | Freq: Every evening | ORAL | Status: DC | PRN

## 2012-09-01 MED ORDER — MENTHOL 3 MG MT LOZG: 1.0000 | LOZENGE | OROMUCOSAL | Status: DC | PRN

## 2012-09-01 MED ORDER — PROMETHAZINE HCL 25 MG/ML IJ SOLN: 6.2500 mg | INTRAMUSCULAR | Status: DC | PRN

## 2012-09-01 MED ORDER — SODIUM CHLORIDE 0.9 % IJ SOLN: 3.0000 mL | INTRAMUSCULAR | Status: DC | PRN

## 2012-09-01 MED ORDER — OXYTOCIN 10 UNIT/ML IJ SOLN: 40.0000 [IU] | INTRAVENOUS | Status: DC | PRN

## 2012-09-01 MED ORDER — HYDROMORPHONE HCL PF 1 MG/ML IJ SOLN
0.2500 mg | INTRAMUSCULAR | Status: DC | PRN
  Administered 2012-09-01 (×2): 0.5 mg via INTRAVENOUS

## 2012-09-01 MED ORDER — KETOROLAC TROMETHAMINE 30 MG/ML IJ SOLN: 30.0000 mg | Freq: Four times a day (QID) | INTRAMUSCULAR | Status: DC | PRN

## 2012-09-01 MED ORDER — DOCUSATE SODIUM 100 MG PO CAPS: 100.0000 mg | ORAL_CAPSULE | Freq: Every day | ORAL | Status: DC

## 2012-09-01 MED ORDER — HYDROMORPHONE HCL PF 1 MG/ML IJ SOLN
INTRAMUSCULAR | Status: AC
  Administered 2012-09-01: 0.5 mg via INTRAVENOUS
  Filled 2012-09-01: qty 1

## 2012-09-01 MED ORDER — PHENYLEPHRINE HCL 10 MG/ML IJ SOLN
INTRAMUSCULAR | Status: DC | PRN
  Administered 2012-09-01 (×15): 40 ug via INTRAVENOUS

## 2012-09-01 MED ORDER — MORPHINE SULFATE 0.5 MG/ML IJ SOLN
INTRAMUSCULAR | Status: AC
  Filled 2012-09-01: qty 10

## 2012-09-01 MED ORDER — DIPHENHYDRAMINE HCL 25 MG PO CAPS: 25.0000 mg | ORAL_CAPSULE | ORAL | Status: DC | PRN

## 2012-09-01 MED ORDER — SCOPOLAMINE 1 MG/3DAYS TD PT72: 1.0000 | MEDICATED_PATCH | Freq: Once | TRANSDERMAL | Status: DC

## 2012-09-01 MED ORDER — LANOLIN HYDROUS EX OINT: 1.0000 "application " | TOPICAL_OINTMENT | CUTANEOUS | Status: DC | PRN

## 2012-09-01 MED ORDER — FENTANYL CITRATE 0.05 MG/ML IJ SOLN
INTRAMUSCULAR | Status: DC | PRN
  Administered 2012-09-01: 50 ug via INTRAVENOUS
  Administered 2012-09-01: 35 ug via INTRAVENOUS

## 2012-09-01 MED ORDER — ONDANSETRON HCL 4 MG PO TABS: 4.0000 mg | ORAL_TABLET | ORAL | Status: DC | PRN

## 2012-09-01 MED ORDER — IBUPROFEN 600 MG PO TABS
600.0000 mg | ORAL_TABLET | Freq: Four times a day (QID) | ORAL | Status: DC
  Administered 2012-09-01 – 2012-09-04 (×10): 600 mg via ORAL
  Filled 2012-09-01 (×10): qty 1

## 2012-09-01 MED ORDER — OXYTOCIN 10 UNIT/ML IJ SOLN
INTRAMUSCULAR | Status: AC
  Filled 2012-09-01: qty 4

## 2012-09-01 MED ORDER — FENTANYL CITRATE 0.05 MG/ML IJ SOLN
INTRAMUSCULAR | Status: AC
  Filled 2012-09-01: qty 2

## 2012-09-01 MED ORDER — LACTATED RINGERS IV SOLN
INTRAVENOUS | Status: DC
  Administered 2012-09-02: 04:00:00 via INTRAVENOUS

## 2012-09-01 MED ORDER — OXYTOCIN 40 UNITS IN LACTATED RINGERS INFUSION - SIMPLE MED: 62.5000 mL/h | INTRAVENOUS | Status: AC

## 2012-09-01 MED ORDER — MAGNESIUM SULFATE BOLUS VIA INFUSION
4.0000 g | Freq: Once | INTRAVENOUS | Status: AC
  Administered 2012-09-01: 4 g via INTRAVENOUS
  Filled 2012-09-01: qty 500

## 2012-09-01 MED ORDER — WITCH HAZEL-GLYCERIN EX PADS: 1.0000 "application " | MEDICATED_PAD | CUTANEOUS | Status: DC | PRN

## 2012-09-01 MED ORDER — MEPERIDINE HCL 25 MG/ML IJ SOLN
6.2500 mg | INTRAMUSCULAR | Status: DC | PRN
  Administered 2012-09-01: 6.25 mg via INTRAVENOUS

## 2012-09-01 MED ORDER — MAGNESIUM SULFATE 40 G IN LACTATED RINGERS - SIMPLE
2.0000 g/h | INTRAVENOUS | Status: DC
  Administered 2012-09-01: 2 g/h via INTRAVENOUS
  Filled 2012-09-01: qty 500

## 2012-09-01 MED ORDER — LACTATED RINGERS IV SOLN
INTRAVENOUS | Status: DC
  Administered 2012-09-01 (×4): via INTRAVENOUS

## 2012-09-01 MED ORDER — CEFAZOLIN SODIUM-DEXTROSE 2-3 GM-% IV SOLR
2.0000 g | Freq: Once | INTRAVENOUS | Status: AC
  Administered 2012-09-01: 2 g via INTRAVENOUS
  Filled 2012-09-01: qty 50

## 2012-09-01 MED ORDER — OXYTOCIN 10 UNIT/ML IJ SOLN
40.0000 [IU] | INTRAVENOUS | Status: DC | PRN
  Administered 2012-09-01: 40 [IU] via INTRAVENOUS

## 2012-09-01 MED ORDER — ACETAMINOPHEN 10 MG/ML IV SOLN: 1000.0000 mg | Freq: Once | INTRAVENOUS | Status: DC | PRN

## 2012-09-01 MED ORDER — PRENATAL MULTIVITAMIN CH
1.0000 | ORAL_TABLET | Freq: Every day | ORAL | Status: DC
  Administered 2012-09-02 – 2012-09-03 (×2): 1 via ORAL
  Filled 2012-09-01 (×2): qty 1

## 2012-09-01 MED ORDER — PRENATAL MULTIVITAMIN CH: 1.0000 | ORAL_TABLET | Freq: Every day | ORAL | Status: DC

## 2012-09-01 MED ORDER — ONDANSETRON HCL 4 MG/2ML IJ SOLN
4.0000 mg | INTRAMUSCULAR | Status: DC | PRN
  Administered 2012-09-01: 4 mg via INTRAVENOUS

## 2012-09-01 MED ORDER — MISOPROSTOL 200 MCG PO TABS
ORAL_TABLET | ORAL | Status: AC
  Filled 2012-09-01: qty 5

## 2012-09-01 MED ORDER — DIBUCAINE 1 % RE OINT: 1.0000 "application " | TOPICAL_OINTMENT | RECTAL | Status: DC | PRN

## 2012-09-01 MED ORDER — NALOXONE HCL 0.4 MG/ML IJ SOLN: 0.4000 mg | INTRAMUSCULAR | Status: DC | PRN

## 2012-09-01 MED ORDER — DIPHENHYDRAMINE HCL 25 MG PO CAPS
25.0000 mg | ORAL_CAPSULE | Freq: Four times a day (QID) | ORAL | Status: DC | PRN
  Administered 2012-09-02: 25 mg via ORAL
  Filled 2012-09-01: qty 1

## 2012-09-01 MED ORDER — MEASLES, MUMPS & RUBELLA VAC ~~LOC~~ INJ
0.5000 mL | INJECTION | Freq: Once | SUBCUTANEOUS | Status: AC
  Administered 2012-09-04: 0.5 mL via SUBCUTANEOUS
  Filled 2012-09-01: qty 0.5

## 2012-09-01 MED ORDER — KETOROLAC TROMETHAMINE 60 MG/2ML IM SOLN: 60.0000 mg | Freq: Once | INTRAMUSCULAR | Status: DC | PRN

## 2012-09-01 MED ORDER — BUPIVACAINE IN DEXTROSE 0.75-8.25 % IT SOLN
INTRATHECAL | Status: DC | PRN
  Administered 2012-09-01: 1.5 mL via INTRATHECAL

## 2012-09-01 MED ORDER — ONDANSETRON HCL 4 MG/2ML IJ SOLN
4.0000 mg | Freq: Three times a day (TID) | INTRAMUSCULAR | Status: DC | PRN
  Filled 2012-09-01: qty 2

## 2012-09-01 MED ORDER — ONDANSETRON HCL 4 MG/2ML IJ SOLN
INTRAMUSCULAR | Status: AC
  Filled 2012-09-01: qty 2

## 2012-09-01 MED ORDER — DIPHENHYDRAMINE HCL 50 MG/ML IJ SOLN: 12.5000 mg | INTRAMUSCULAR | Status: DC | PRN

## 2012-09-01 MED ORDER — NALOXONE HCL 1 MG/ML IJ SOLN: 1.0000 ug/kg/h | INTRAMUSCULAR | Status: DC | PRN

## 2012-09-01 MED ORDER — MEPERIDINE HCL 25 MG/ML IJ SOLN
INTRAMUSCULAR | Status: AC
  Filled 2012-09-01: qty 1

## 2012-09-01 MED ORDER — ACETAMINOPHEN 325 MG PO TABS: 650.0000 mg | ORAL_TABLET | ORAL | Status: DC | PRN

## 2012-09-01 MED ORDER — SIMETHICONE 80 MG PO CHEW
80.0000 mg | CHEWABLE_TABLET | ORAL | Status: DC | PRN
  Administered 2012-09-02 – 2012-09-03 (×2): 80 mg via ORAL

## 2012-09-01 MED ORDER — MORPHINE SULFATE (PF) 0.5 MG/ML IJ SOLN
INTRAMUSCULAR | Status: DC | PRN
  Administered 2012-09-01: .1 mg via INTRATHECAL

## 2012-09-01 MED ORDER — MISOPROSTOL 100 MCG PO TABS
ORAL_TABLET | ORAL | Status: DC | PRN
  Administered 2012-09-01: 1000 ug via RECTAL

## 2012-09-01 MED ORDER — NIFEDIPINE 10 MG PO CAPS
10.0000 mg | ORAL_CAPSULE | Freq: Once | ORAL | Status: AC
  Administered 2012-09-01: 10 mg via ORAL
  Filled 2012-09-01: qty 1

## 2012-09-01 MED ORDER — DIPHENHYDRAMINE HCL 50 MG/ML IJ SOLN: 25.0000 mg | INTRAMUSCULAR | Status: DC | PRN

## 2012-09-01 MED ORDER — FENTANYL CITRATE 0.05 MG/ML IJ SOLN
INTRAMUSCULAR | Status: DC | PRN
  Administered 2012-09-01: 15 ug via INTRATHECAL

## 2012-09-01 MED ORDER — METHYLERGONOVINE MALEATE 0.2 MG/ML IJ SOLN
INTRAMUSCULAR | Status: AC
  Filled 2012-09-01: qty 1

## 2012-09-01 MED ORDER — CALCIUM CARBONATE ANTACID 500 MG PO CHEW: 2.0000 | CHEWABLE_TABLET | ORAL | Status: DC | PRN

## 2012-09-01 MED ORDER — SODIUM CHLORIDE 0.9 % IR SOLN
Status: DC | PRN
  Administered 2012-09-01: 1000 mL

## 2012-09-01 MED ORDER — SCOPOLAMINE 1 MG/3DAYS TD PT72
MEDICATED_PATCH | TRANSDERMAL | Status: AC
  Administered 2012-09-01: 1.5 mg via TRANSDERMAL
  Filled 2012-09-01: qty 1

## 2012-09-01 MED ORDER — METHYLERGONOVINE MALEATE 0.2 MG/ML IJ SOLN
INTRAMUSCULAR | Status: DC | PRN
  Administered 2012-09-01: 0.2 mg via INTRAMUSCULAR

## 2012-09-01 MED ORDER — MEPERIDINE HCL 25 MG/ML IJ SOLN: 6.2500 mg | INTRAMUSCULAR | Status: DC | PRN

## 2012-09-01 MED ORDER — DIPHENHYDRAMINE HCL 50 MG/ML IJ SOLN
INTRAMUSCULAR | Status: AC
  Administered 2012-09-01: 12.5 mg via INTRAVENOUS
  Filled 2012-09-01: qty 1

## 2012-09-01 MED ORDER — ONDANSETRON HCL 4 MG/2ML IJ SOLN
INTRAMUSCULAR | Status: DC | PRN
  Administered 2012-09-01: 4 mg via INTRAVENOUS

## 2012-09-01 SURGICAL SUPPLY — 51 items
ADH SKN CLS LQ APL DERMABOND (GAUZE/BANDAGES/DRESSINGS) ×1
CLAMP CORD UMBIL (MISCELLANEOUS) ×2 IMPLANT
CLOTH BEACON ORANGE TIMEOUT ST (SAFETY) ×2 IMPLANT
CONTAINER PREFILL 10% NBF 15ML (MISCELLANEOUS) IMPLANT
DERMABOND ADHESIVE PROPEN (GAUZE/BANDAGES/DRESSINGS) ×1
DERMABOND ADVANCED .7 DNX6 (GAUZE/BANDAGES/DRESSINGS) IMPLANT
DRAIN JACKSON PRT FLT 7MM (DRAIN) IMPLANT
DRAPE LG THREE QUARTER DISP (DRAPES) ×3 IMPLANT
DRSG OPSITE 11X17.75 LRG (GAUZE/BANDAGES/DRESSINGS) ×1 IMPLANT
DRSG OPSITE POSTOP 4X10 (GAUZE/BANDAGES/DRESSINGS) ×2 IMPLANT
DURAPREP 26ML APPLICATOR (WOUND CARE) ×2 IMPLANT
ELECT REM PT RETURN 9FT ADLT (ELECTROSURGICAL) ×2
ELECTRODE REM PT RTRN 9FT ADLT (ELECTROSURGICAL) ×1 IMPLANT
EVACUATOR SILICONE 100CC (DRAIN) IMPLANT
EXTRACTOR VACUUM M CUP 4 TUBE (SUCTIONS) IMPLANT
FORMULA NB 0-3 ENFAMIL (FORMULA) ×1 IMPLANT
GLOVE BIO SURGEON STRL SZ7 (GLOVE) ×2 IMPLANT
GLOVE BIOGEL PI IND STRL 7.0 (GLOVE) ×1 IMPLANT
GLOVE BIOGEL PI INDICATOR 7.0 (GLOVE) ×6
GOWN STRL REIN XL XLG (GOWN DISPOSABLE) ×4 IMPLANT
HEMOSTAT SURGICEL 2X14 (HEMOSTASIS) ×1 IMPLANT
KIT ABG SYR 3ML LUER SLIP (SYRINGE) ×2 IMPLANT
NDL BLUNT 18X1 FOR OR ONLY (NEEDLE) IMPLANT
NDL HYPO 18GX1.5 BLUNT FILL (NEEDLE) IMPLANT
NDL HYPO 25X5/8 SAFETYGLIDE (NEEDLE) ×1 IMPLANT
NEEDLE BLUNT 18X1 FOR OR ONLY (NEEDLE) ×2 IMPLANT
NEEDLE HYPO 18GX1.5 BLUNT FILL (NEEDLE) ×2 IMPLANT
NEEDLE HYPO 25X5/8 SAFETYGLIDE (NEEDLE) ×4 IMPLANT
NS IRRIG 1000ML POUR BTL (IV SOLUTION) ×2 IMPLANT
PACK C SECTION WH (CUSTOM PROCEDURE TRAY) ×2 IMPLANT
PAD ABD 7.5X8 STRL (GAUZE/BANDAGES/DRESSINGS) ×1 IMPLANT
PAD OB MATERNITY 4.3X12.25 (PERSONAL CARE ITEMS) ×2 IMPLANT
RTRCTR C-SECT PINK 25CM LRG (MISCELLANEOUS) ×2 IMPLANT
SET CYSTO W/LG BORE CLAMP LF (SET/KITS/TRAYS/PACK) ×1 IMPLANT
STAPLER VISISTAT 35W (STAPLE) IMPLANT
SUT MNCRL 0 VIOLET CTX 36 (SUTURE) IMPLANT
SUT MON AB 2-0 CT1 36 (SUTURE) ×2 IMPLANT
SUT MON AB 2-0 SH 27 (SUTURE) ×2 IMPLANT
SUT MON AB-0 CT1 36 (SUTURE) ×2 IMPLANT
SUT MONOCRYL 0 CTX 36 (SUTURE) ×2
SUT VIC AB 0 CTX 36 (SUTURE) ×14
SUT VIC AB 0 CTX36XBRD ANBCTRL (SUTURE) ×5 IMPLANT
SUT VIC AB 4-0 KS 27 (SUTURE) ×1 IMPLANT
SYR BULB 3OZ (MISCELLANEOUS) ×1 IMPLANT
SYR TOOMEY 50ML (SYRINGE) ×1 IMPLANT
SYRINGE 10CC LL (SYRINGE) ×1 IMPLANT
TAPE CLOTH SURG 4X10 WHT LF (GAUZE/BANDAGES/DRESSINGS) ×1 IMPLANT
TOWEL OR 17X24 6PK STRL BLUE (TOWEL DISPOSABLE) ×7 IMPLANT
TRAY FOLEY CATH 14FR (SET/KITS/TRAYS/PACK) ×2 IMPLANT
WATER STERILE IRR 1000ML POUR (IV SOLUTION) ×2 IMPLANT
WATER STERILE IRR 1000ML UROMA (IV SOLUTION) ×1 IMPLANT

## 2012-09-01 NOTE — Transfer of Care (Signed)
Immediate Anesthesia Transfer of Care Note  Patient: Laurie Hall  Procedure(s) Performed: Procedure(s): CESAREAN SECTION (N/A)  Patient Location: PACU  Anesthesia Type:Spinal  Level of Consciousness: awake and alert   Airway & Oxygen Therapy: Patient Spontanous Breathing  Post-op Assessment: Report given to PACU RN and Post -op Vital signs reviewed and stable  Post vital signs: Reviewed and stable  Complications: No apparent anesthesia complications

## 2012-09-01 NOTE — Op Note (Signed)
Laurie Hall PROCEDURE DATE: 09/01/2012  PREOPERATIVE DIAGNOSIS: Intrauterine pregnancy at  [redacted]w[redacted]d weeks twin gestation in labor with history of classical cesarean section  POSTOPERATIVE DIAGNOSIS:  Intrauterine pregnancy at  [redacted]w[redacted]d weeks twin gestation in labor with history of classical cesarean section with separatoin of vertical uterine incision  PROCEDURE:    Low Transverse Cesarean Section  SURGEON:  Dr. Elsie Lincoln  ASSISTANT: Zerita Boers, CNM  INDICATIONS: Laurie Hall is a 27 y.o. [id number] at [redacted]w[redacted]d with twin gestation for cesarean section secondary labor an history of classical cesarean section.  The risks of cesarean section discussed with the patient included but were not limited to: bleeding which may require transfusion or reoperation; infection which may require antibiotics; injury to bowel, bladder, ureters or other surrounding organs; injury to the fetus; need for additional procedures including hysterectomy in the event of a life-threatening hemorrhage; placental abnormalities wth subsequent pregnancies, incisional problems, thromboembolic phenomenon and other postoperative/anesthesia complications. The patient concurred with the proposed plan, giving informed written consent for the procedure.    FINDINGS:  Baby A = viable female infant in cephalic presentation, Baby B = viable female infant in cephalic presentation,  clear amniotic fluid x2.  Intact placenta, three vessel cord x2.  Uterus with separation of vertical incision with copious adhesions to omentum with large vessels entering the uterine serosa.  Ovaries and fallopian tubes are not visual nor palpable.  Bladder also connected to omentum with large vessels involved in adhesions .   ANESTHESIA:    Epidural  ESTIMATED BLOOD LOSS: 1800 ml  SPECIMENS: Placenta sent to pathology  COMPLICATIONS: None immediate  Urine:  300 cc clear urine  PROCEDURE IN DETAIL:  The patient received intravenous antibiotics  and had sequential compression devices applied to her lower extremities.  Spinal anesthesia was administered and was found to be adequate. She was then placed in a dorsal supine position with a leftward tilt, and prepped and draped in a sterile manner.  A foley catheter was placed into her bladder and attached to constant gravity.  After an adequate timeout was performed, a Pfannenstiel skin incision was made with scalpel and carried through to the underlying layer of fascia. The fascia was incised in the midline and this incision was extended bilaterally using the Mayo scissors. Kocher clamps were applied to the superior aspect of the fascial incision and the underlying rectus muscles were dissected off bluntly. A similar process was carried out on the inferior aspect of the facial incision. The rectus muscles were separated in the midline bluntly.  It was peritoneal cavity was entered carefully due to the large vessels seen in the omentum and uterus.   A transverse hysterotomy was made with a scalpel and extended bilaterally bluntly. The bladder blade was then removed. Infant A was successfully delivered, and cord was clamped and cut and infant was handed over to awaiting neonatology team.  The second sac was ruptured and Infant B was delivered.  Uterine massage was then administered and the placentas delivered intact with three-vessel cord x2. The uterus was cleared of clot and debris.  The hysterotomy was closed with 0 vicryl.  A second suture was used to bring together the window of the vertical incision.  This area was heavily involved with the very vasculare omentum.  The large vessels near the bladder with suture ligated.  The bladder was retrograde filled with sterile milk and there was no extravasation of fluid.  The uterus was atonic during while closing so methergine  and cytotec were given.    Hemostasis was noted on all levels and organs.  The fascia was closed with 0-Vicryl in a running fashion with  good restoration of anatomy.  The subcutaneus tissue was copiously irrigated.  The skin was closed with 4-0 Vicryl in a subcuticular fashion.  Dermabond was place over the incision as well.  A pressure dressing was applied.  Pt tolerated the procedure will.  All counts were correct x2.  Pt went to the recovery room in stable condition.

## 2012-09-01 NOTE — Anesthesia Procedure Notes (Signed)
Spinal  Patient location during procedure: OR Start time: 09/01/2012 3:40 PM End time: 09/01/2012 3:43 PM Staffing Anesthesiologist: Lewie Loron R Performed by: anesthesiologist  Preanesthetic Checklist Completed: patient identified, site marked, surgical consent, pre-op evaluation, timeout performed, IV checked, risks and benefits discussed and monitors and equipment checked Spinal Block Patient position: sitting Prep: DuraPrep Patient monitoring: heart rate, continuous pulse ox and blood pressure Location: L3-4 Injection technique: single-shot Needle Needle type: Sprotte  Needle gauge: 24 G Needle length: 9 cm Assessment Sensory level: T8 Additional Notes Expiration date of kit checked and confirmed. Patient tolerated procedure well, without complications.

## 2012-09-01 NOTE — OR Nursing (Signed)
Uterus massaged by S. Oney Tatlock.  160 cc of blood evacuated from uterus  during uterine massage. Four tubes of cord blood sent to lab.

## 2012-09-01 NOTE — Progress Notes (Signed)
Anesth and NICU notified of urgent C/S for PTL of twins at 73 4/7, previous classical C/S. Pt prepped for C/S.

## 2012-09-01 NOTE — Anesthesia Preprocedure Evaluation (Signed)
Anesthesia Evaluation  Patient identified by MRN, date of birth, ID band Patient awake    Reviewed: Allergy & Precautions, H&P , NPO status , Patient's Chart, lab work & pertinent test results  Airway Mallampati: II TM Distance: >3 FB Neck ROM: Full    Dental  (+) Dental Advisory Given   Pulmonary former smoker,          Cardiovascular negative cardio ROS      Neuro/Psych negative neurological ROS  negative psych ROS   GI/Hepatic negative GI ROS, Neg liver ROS,   Endo/Other  negative endocrine ROS  Renal/GU negative Renal ROS     Musculoskeletal negative musculoskeletal ROS (+)   Abdominal   Peds  Hematology negative hematology ROS (+)   Anesthesia Other Findings   Reproductive/Obstetrics (+) Pregnancy                           Anesthesia Physical Anesthesia Plan  ASA: II  Anesthesia Plan: Spinal   Post-op Pain Management:    Induction:   Airway Management Planned:   Additional Equipment:   Intra-op Plan:   Post-operative Plan:   Informed Consent: I have reviewed the patients History and Physical, chart, labs and discussed the procedure including the risks, benefits and alternatives for the proposed anesthesia with the patient or authorized representative who has indicated his/her understanding and acceptance.   Dental advisory given  Plan Discussed with: CRNA  Anesthesia Plan Comments:         Anesthesia Quick Evaluation

## 2012-09-01 NOTE — MAU Provider Note (Signed)
History     CSN: [id number]  Arrival date and time: 09/01/12 4098   None     Chief Complaint  Patient presents with  . Contractions   HPI  Pt presents complaning of contractions.  Pt has di/di twins.  Hs history of preterm delivery but has been noncompliant with home therapy (procardia).  Pt denies LOF, VB.  Pt reports good fetal movement.  OB History   Grav Para Term Preterm Abortions TAB SAB Ect Mult Living   7 4 1 3 2 1 1  0 0 3      Past Medical History  Diagnosis Date  . Urinary tract infection   . Meningitis   . Ovarian cyst   . Abnormal Pap smear     x4, repeats ok  . Heart palpitations     Past Surgical History  Procedure Laterality Date  . Cesarean section    . Therapeutic abortion      Family History  Problem Relation Age of Onset  . Other Neg Hx   . Hyperthyroidism Mother   . Heart disease Mother   . Diabetes Mother   . Hyperthyroidism Maternal Aunt   . Hypothyroidism Maternal Grandmother   . Heart murmur Maternal Grandmother   . Diabetes Maternal Grandmother   . Hyperthyroidism Maternal Grandfather   . Heart disease Maternal Grandfather     History  Substance Use Topics  . Smoking status: Former Smoker -- 0.25 packs/day for 8 years    Types: Cigarettes    Quit date: 04/04/2012  . Smokeless tobacco: Never Used  . Alcohol Use: No     Comment: social    Allergies: No Known Allergies  Facility-administered medications prior to admission  Medication Dose Route Frequency Provider Last Rate Last Dose  . hydroxyprogesterone caproate (DELALUTIN) 250 mg/mL injection 250 mg  250 mg Intramuscular Weekly Peggy Constant, MD   250 mg at 08/29/12 1535   Prescriptions prior to admission  Medication Sig Dispense Refill  . diphenhydrAMINE (BENADRYL) 25 MG tablet Take 25 mg by mouth every 6 (six) hours as needed for itching or allergies.      Marland Kitchen NIFEdipine (PROCARDIA-XL/ADALAT CC) 30 MG 24 hr tablet Take 1 tablet (30 mg total) by mouth 2 (two) times  daily.  60 tablet  2  . Prenatal Vit-Fe Fumarate-FA (PRENATAL MULTIVITAMIN) TABS Take 1 tablet by mouth at bedtime.        Review of Systems  Constitutional: Negative.   Respiratory: Negative.   Cardiovascular: Negative.   Gastrointestinal: Negative.    Physical Exam   Blood pressure 105/66, pulse 93, temperature 98.5 F (36.9 C), temperature source Oral, resp. rate 18, height 5\' 9"  (1.753 m), weight 177 lb (80.287 kg), last menstrual period 12/12/2011.  Physical Exam  Vitals reviewed. Constitutional: She is oriented to person, place, and time. She appears well-developed and well-nourished. No distress.  HENT:  Head: Normocephalic and atraumatic.  Eyes: Conjunctivae are normal.  Respiratory: Effort normal.  GI: Soft.  Genitourinary: Vagina normal.  2-3/60/-1  Posterior with tone.  Vertex presentation of baby A  Musculoskeletal: She exhibits no edema and no tenderness.  Neurological: She is alert and oriented to person, place, and time.  Skin: Skin is warm and dry.  Psychiatric: She has a normal mood and affect.    MAU Course  Procedures  MDM   Assessment and Plan  27 yo [id number] at 32 weeks 4 days with preterm contractions.  Cervix has good tone and is very posterior.  Dilation is 2 -3 cm where she was 1 cm lst week (different examiner)  1-FFN 2-Procardia 3-Pt for rpt cs due to prior classical c/s.  Ociel Retherford H. 09/01/2012, 10:38 AM

## 2012-09-01 NOTE — MAU Note (Addendum)
Patient presents to MAU with c/o contractions since 0500 today. Denies vaginal bleeding, LOF; reports +FM.  Patient is scheduled for repeat c-section 7/30. Twin pg. Last dose of Procardia at 2000 last night.

## 2012-09-01 NOTE — Anesthesia Postprocedure Evaluation (Signed)
Anesthesia Post Note  Patient: Laurie Hall  Procedure(s) Performed: Procedure(s) (LRB): Repeat cesarean section with delivery of Baby A girl at 8. Baby B boy at 1600. (N/A)  Anesthesia type: Spinal  Patient location: PACU  Post pain: Pain level controlled  Post assessment: Post-op Vital signs reviewed  Last Vitals: BP 117/69  Pulse 90  Temp(Src) 36.8 C (Oral)  Resp 16  Ht 5\' 9"  (1.753 m)  Wt 177 lb (80.287 kg)  BMI 26.13 kg/m2  SpO2 100%  LMP 12/12/2011  Post vital signs: Reviewed  Level of consciousness: sedated  Complications: No apparent anesthesia complications

## 2012-09-01 NOTE — Progress Notes (Signed)
Patient ID: Laurie Hall, female   DOB: 1985-08-15, 27 y.o.   MRN: [id number]  Pt complaining of painful contractions rating 9/10.   Cervix 3.5/70/-1 ; cervix is softer and more midposition.  Pt in labor and not responding to magnesium sulfate.  Pt also failed procardia. Will proceed with rpt c/s due to prior classical c/s  The risks of cesarean section discussed with the patient included but were not limited to: bleeding which may require transfusion or reoperation; infection which may require antibiotics; injury to bowel, bladder, ureters or other surrounding organs; injury to the fetus; need for additional procedures including hysterectomy in the event of a life-threatening hemorrhage; placental abnormalities wth subsequent pregnancies, incisional problems, thromboembolic phenomenon and other postoperative/anesthesia complications. The patient concurred with the proposed plan, giving informed written consent for the procedure.

## 2012-09-02 ENCOUNTER — Encounter (HOSPITAL_COMMUNITY): Payer: Self-pay | Admitting: *Deleted

## 2012-09-02 LAB — CBC
HCT: 22.9 % — ABNORMAL LOW (ref 36.0–46.0)
Hemoglobin: 8.3 g/dL — ABNORMAL LOW (ref 12.0–15.0)
Hemoglobin: 8.6 g/dL — ABNORMAL LOW (ref 12.0–15.0)
MCH: 32.8 pg (ref 26.0–34.0)
MCHC: 36.2 g/dL — ABNORMAL HIGH (ref 30.0–36.0)
MCHC: 36.3 g/dL — ABNORMAL HIGH (ref 30.0–36.0)
MCV: 90.5 fL (ref 78.0–100.0)
MCV: 90.5 fL (ref 78.0–100.0)
Platelets: 112 10*3/uL — ABNORMAL LOW (ref 150–400)
RBC: 2.62 MIL/uL — ABNORMAL LOW (ref 3.87–5.11)
RDW: 13.1 % (ref 11.5–15.5)

## 2012-09-02 MED ORDER — TRAMADOL HCL 50 MG PO TABS
50.0000 mg | ORAL_TABLET | Freq: Four times a day (QID) | ORAL | Status: DC | PRN
  Administered 2012-09-02 (×2): 50 mg via ORAL
  Filled 2012-09-02 (×2): qty 1

## 2012-09-02 MED ORDER — RHO D IMMUNE GLOBULIN 1500 UNIT/2ML IJ SOLN
300.0000 ug | Freq: Once | INTRAMUSCULAR | Status: AC
Start: 2012-09-02 — End: 2012-09-02
  Administered 2012-09-02: 300 ug via INTRAMUSCULAR
  Filled 2012-09-02: qty 2

## 2012-09-02 NOTE — Progress Notes (Signed)
Admission nutrition screen triggered. Overall weight gain of 44 lbs.  Patients chart reviewed and assessed  for nutritional risk. Patient is determined to be at low nutrition  risk.   Elisabeth Cara M.Odis Luster LDN Neonatal Nutrition Support Specialist Pager [phone]

## 2012-09-02 NOTE — H&P (Signed)
HPI   Pt presents complaning of contractions.  Pt has di/di twins.  Hs history of preterm delivery but has been noncompliant with home therapy (procardia).  Pt denies LOF, VB.  Pt reports good fetal movement.    OB History     Grav  Para  Term  Preterm  Abortions  TAB  SAB  Ect  Mult  Living     7  4  1  3  2  1  1   0  0  3            Past Medical History   Diagnosis  Date   .  Urinary tract infection     .  Meningitis     .  Ovarian cyst     .  Abnormal Pap smear         x4, repeats ok   .  Heart palpitations           Past Surgical History   Procedure  Laterality  Date   .  Cesarean section       .  Therapeutic abortion             Family History   Problem  Relation  Age of Onset   .  Other  Neg Hx     .  Hyperthyroidism  Mother     .  Heart disease  Mother     .  Diabetes  Mother     .  Hyperthyroidism  Maternal Aunt     .  Hypothyroidism  Maternal Grandmother     .  Heart murmur  Maternal Grandmother     .  Diabetes  Maternal Grandmother     .  Hyperthyroidism  Maternal Grandfather     .  Heart disease  Maternal Grandfather           History   Substance Use Topics   .  Smoking status:  Former Smoker -- 0.25 packs/day for 8 years       Types:  Cigarettes       Quit date:  04/04/2012   .  Smokeless tobacco:  Never Used   .  Alcohol Use:  No         Comment: social        Allergies: No Known Allergies    Facility-administered medications prior to admission   Medication  Dose  Route  Frequency  Provider  Last Rate  Last Dose   .  hydroxyprogesterone caproate (DELALUTIN) 250 mg/mL injection 250 mg   250 mg  Intramuscular  Weekly  Peggy Constant, MD     250 mg at 08/29/12 1535       Prescriptions prior to admission   Medication  Sig  Dispense  Refill   .  diphenhydrAMINE (BENADRYL) 25 MG tablet  Take 25 mg by mouth every 6 (six) hours as needed for itching or allergies.         Marland Kitchen  NIFEdipine (PROCARDIA-XL/ADALAT CC) 30 MG 24 hr tablet  Take 1  tablet (30 mg total) by mouth 2 (two) times daily.   60 tablet   2   .  Prenatal Vit-Fe Fumarate-FA (PRENATAL MULTIVITAMIN) TABS  Take 1 tablet by mouth at bedtime.              Review of Systems  Constitutional: Negative.   Respiratory: Negative.   Cardiovascular: Negative.   Gastrointestinal: Negative.      Physical Exam  Blood pressure 105/66, pulse 93, temperature 98.5 F (36.9 C), temperature source Oral, resp. rate 18, height 5\' 9"  (1.753 m), weight 177 lb (80.287 kg), last menstrual period 12/12/2011.   Physical Exam  Vitals reviewed. Constitutional: She is oriented to person, place, and time. She appears well-developed and well-nourished. No distress.  HENT:   Head: Normocephalic and atraumatic.  Eyes: Conjunctivae are normal.  Respiratory: Effort normal.  GI: Soft.  Genitourinary: Vagina normal.  2-3/60/-1  Posterior with tone.  Vertex presentation of baby A  Musculoskeletal: She exhibits no edema and no tenderness.  Neurological: She is alert and oriented to person, place, and time.  Skin: Skin is warm and dry.  Psychiatric: She has a normal mood and affect.      Assessment and Plan    27 yo [id number] at 32 weeks 4 days with preterm contractions.  Cervix has good tone and is very posterior.  Dilation is 2 -3 cm where she was 1 cm lst week (different examiner)  Pt failed procardia in MAU.  Will start magnesium sulfate.  If not responding and making cervical change, will proceed with c/s.   Leonette Tischer H. 09/01/2012, 10:38 AM

## 2012-09-02 NOTE — Progress Notes (Signed)
Ur chart review completed.  

## 2012-09-02 NOTE — Lactation Note (Signed)
This note was copied from the chart of Laurie Hall. Lactation Consultation Note  Patient Name: Laurie Hall Today's Date: 09/02/2012 Reason for consult: Other (Comment);Initial assessment   Maternal Data Formula Feeding for Exclusion: Yes Reason for exclusion: Mother's choice to forumla feed on admision  Feeding Feeding Type: Formula Feeding method: Tube/Gavage Length of feed: 10 min (gravity)  LATCH Score/Interventions                      Lactation Tools Discussed/Used     Consult Status      Angelena Sand Anne 09/02/2012, 5:05 PM    

## 2012-09-03 MED ORDER — ZOLPIDEM TARTRATE 5 MG PO TABS
5.0000 mg | ORAL_TABLET | Freq: Every evening | ORAL | Status: DC | PRN
  Administered 2012-09-03 – 2012-09-04 (×2): 5 mg via ORAL
  Filled 2012-09-03 (×2): qty 1

## 2012-09-03 NOTE — Progress Notes (Signed)
Subjective: Postpartum Day 2: Classical Cesarean Delivery Patient reports incisional pain, tolerating PO, + flatus and no problems voiding.    Objective: Vital signs in last 24 hours: Temp:  [97.8 F (36.6 C)-98.3 F (36.8 C)] 97.8 F (36.6 C) (07/08 0552) Pulse Rate:  [72-91] 72 (07/08 0552) Resp:  [18] 18 (07/08 0552) BP: (97-116)/(58-68) 111/66 mmHg (07/08 0552) SpO2:  [100 %] 100 % (07/08 0552)  Physical Exam:  General: alert, cooperative and no distress Lochia: appropriate Uterine Fundus: firm Incision: no significant drainage.  Pressure dressing in place over honeycomb. No evident dressing. DVT Evaluation: No evidence of DVT seen on physical exam. Negative Homan's sign. No cords or calf tenderness. No significant calf/ankle edema.   Recent Labs  09/02/12 0512 09/02/12 0731  HGB 8.3* 8.6*  HCT 22.9* 23.7*   Scheduled Meds: . ibuprofen  600 mg Oral Q6H  . measles, mumps and rubella vaccine  0.5 mL Subcutaneous Once  . prenatal multivitamin  1 tablet Oral Q1200  . scopolamine  1 patch Transdermal Once  . TDaP  0.5 mL Intramuscular Once   Continuous Infusions: . lactated ringers 125 mL/hr at 09/02/12 0413  . naLOXone [facility]) adult infusion for PRURITIS     PRN Meds:.dibucaine, diphenhydrAMINE, diphenhydrAMINE, diphenhydrAMINE, diphenhydrAMINE, lanolin, menthol-cetylpyridinium, metoCLOPramide (REGLAN) injection, nalbuphine, nalbuphine, naLOXone (NARCAN) adult infusion for PRURITIS, naloxone, ondansetron (ZOFRAN) IV, ondansetron (ZOFRAN) IV, ondansetron, oxyCODONE-acetaminophen, simethicone, sodium chloride, traMADol, witch hazel-glycerin, zolpidem   Assessment/Plan: Status post Cesarean section. Doing well postoperatively.  Anemia - stable, asymptomatic Rh negative - already received RhoGam. Babies will be formula-fed (does not plan to breastfeed). They are currently in NICU with tube feeds but doing well Pt plans Depo Provera for contraception Plans on  discharge home tomorrow.  Laurie Hall 09/03/2012, 7:20 AM

## 2012-09-03 NOTE — Progress Notes (Signed)
09/03/12 1200  Clinical Encounter Type  Visited With Patient  Visit Type Initial;Spiritual support;Social support  Referral From Lezlie Lye, [place])  Spiritual Encounters  Spiritual Needs Emotional   Made initial visit with Laurie Hall to introduce spiritual care.  Per pt, she has good support, including care for her three older children while she is here.  Previous NICU experience helps her cope, as well.  She is aware of ongoing chaplain availability and appreciates the understanding of spiritual care as support for the whole person and whole family.  Per pt, no particular needs at this time.  [address] [place], [place] [phone]

## 2012-09-04 LAB — TYPE AND SCREEN
Antibody Screen: NEGATIVE
Unit division: 0

## 2012-09-04 LAB — RH IG WORKUP (INCLUDES ABO/RH): Gestational Age(Wks): 32

## 2012-09-04 MED ORDER — SENNA-DOCUSATE SODIUM 8.6-50 MG PO TABS: 1.0000 | ORAL_TABLET | Freq: Every day | ORAL | Status: DC

## 2012-09-04 MED ORDER — OXYCODONE-ACETAMINOPHEN 5-325 MG PO TABS: 1.0000 | ORAL_TABLET | ORAL | Status: DC | PRN

## 2012-09-04 NOTE — Discharge Summary (Signed)
Obstetric Discharge Summary Reason for Admission: cesarean section after hx of classical, preterm twins Prenatal Procedures: none Intrapartum Procedures: cesarean: low cervical, transverse Postpartum Procedures: Rho(D) Ig Complications-Operative and Postpartum: none Hemoglobin  Date Value Range Status  09/02/2012 8.6* 12.0 - 15.0 g/dL Final     HCT  Date Value Range Status  09/02/2012 23.7* 36.0 - 46.0 % Final    Physical Exam:  General: alert and no distress Lochia: appropriate Uterine Fundus: firm Incision: healing well, no significant drainage, no significant erythema, honeycomb dressing without any blood on it DVT Evaluation: No evidence of DVT seen on physical exam.  Discharge Diagnoses: Premature labor  Discharge Information: Date: 09/04/2012 Activity: pelvic rest Diet: routine Medications: Ibuprofen and Percocet Condition: stable Instructions: refer to practice specific booklet Discharge to: home  Follow-up Information   Follow up with [facility]. Schedule an appointment as soon as possible for a visit in 2 weeks.   Contact information:   [address] [place] [address] [phone]      Newborn Data:   Christie, Viscomi [[id number]]  Live born female  Birth Weight: 4 lb 6.9 oz (2010 g) APGAR: 7, 8   Makaylah, Oddo [[id number]]  Live born female  Birth Weight: 3 lb 11.8 oz (1695 g) APGAR: 7, 8  Babies staying in NICU  Tawni Carnes 09/04/2012, 7:19 AM  I saw and examined patient and agree with above resident note. I reviewed history, delivery summary, labs and vitals. [name], MD

## 2012-09-04 NOTE — Progress Notes (Signed)
Clinical Social Work Department PSYCHOSOCIAL ASSESSMENT - MATERNAL/CHILD 09/03/2012  Patient:  Laurie Hall,Laurie Hall  Account Number:  [id number]  Admit Date:  09/01/2012  Childs Name:   Laurie Hall    Clinical Social Worker:  Laneya Gasaway, LCSW   Date/Time:  09/03/2012 02:00 PM  Date Referred:  09/03/2012   Referral source  NICU     Referred reason  NICU   Other referral source:    I:  FAMILY / HOME ENVIRONMENT Child's legal guardian:  PARENT  Guardian - Name Guardian - Age Guardian - Address  Laurie Hall 26 [address] [place], [place] [address]  Laurie Hall 29 [place] [facility]   Other household support members/support persons Name Relationship DOB  Laurie Hall DAUGHTER 11  Laurie Hall DAUGHTER 8  Laurie Hall SON 7   Other support:   MOB states her parents are supportive.  She states she and FOB have been together 5.5 years and they will be together when he is released from jail.    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[employer]."   Financial resources:  Medicaid If Medicaid - County:  [place]  School / Grade:   Maternity Care Coordinator / Child Services Coordination / Early Interventions:   [facility]  Cultural issues impacting care:   None stated    III  STRENGTHS Strengths  Adequate Resources  Compliance with medical plan  Understanding of illness  Supportive family/friends  Home prepared for Child (including basic supplies)  Other - See comment   Strength comment:  MOB states her 3 older children go to [facility] for pediatric care, but that she does not want to take the twins there.  CSW discussed options such as calling private offices to see if new medicaid patients are being accepted or planning to take babies to [facility] and transferring other childrens' care there as well.  MOB states she  would like babies to be followed at [facility] at discharge.  CSW to inform NICU team.   IV  RISK FACTORS AND CURRENT PROBLEMS Current Problem:  YES   Risk Factor & Current Problem Patient Issue Family Issue Risk Factor / Current Problem Comment  Other - See comment N Y FOB in jail.  CSW aware of safety concerns if he is to be released.   N N     V  SOCIAL WORK ASSESSMENT  CSW met with MOB in her third floor room/319 to introduce myself and complete assessment due to NICU admission.  The room was dark and MOB was resting so CSW offered to return at a later time, but MOB stated it was fine for CSW to talk with her at this time.  MOB was quiet, but very pleasant.  She states she is doing well and reports that babies are also well.  She states she has had two other premature births/admissions to NICU.  She states her 29 year old was born at 33 weeks and her 29 year old was born at 29 weeks.  Both [facility] and admitted to the [facility].  She states that she and her children currently live with her parents, who are very supportive.  Her parents are caring for children while she is in the hospital.  She informed CSW that FOB is in jail at this time in [place] and that she sees and talks to him on   a regular basis.  CSW asked what his name is and MOB replied, "Laurie Hall."  She states he was arrested on 02/19/12 for "obtaining property under false pretense."  She states he has not been given a court date yet, but she expects his case to be heard soon and for him to be released.  She states she feels she is coping well with the situation at this time.  She reports having most baby items at home other than preemie car seats for the twins.  She states she paid a donation to the fire department for two car seats that state they fit babies as small as 4lbs, but that they are not the carrier type seat.  CSW suggested that she speak with baby's bedside RN regarding the car seats she purchased.  CSW  discussed signs and symptoms of PPD and encouraged MOB to talk with CSW if she has concerns at any time.  She agreed and denies any hx of PPD after other births.  MOB states no further questions or needs at this time.  CSW is aware of safety concerns with FOB and therefore made report to Child Protective Services.  CSW informed CPS intake worker that MOB is not aware that CSW knows of these concerns.  CSW will follow up to see if report is accepted.       VI SOCIAL WORK PLAN Social Work Plan  Psychosocial Support/Ongoing Assessment of Needs  Child Protective Services Report   Type of pt/family education:   PPD signs and symptoms  Ongoing support services offered by NICU CSW   If child protective services report - county:  [place] If child protective services report - date:  09/03/2012 Information/referral to community resources comment:   [facility]   Other social work plan:    

## 2012-09-05 ENCOUNTER — Ambulatory Visit: Payer: Medicaid Other

## 2012-09-12 ENCOUNTER — Ambulatory Visit (INDEPENDENT_AMBULATORY_CARE_PROVIDER_SITE_OTHER): Payer: Medicaid Other | Admitting: Obstetrics & Gynecology

## 2012-09-12 ENCOUNTER — Encounter: Payer: Medicaid Other | Admitting: Obstetrics and Gynecology

## 2012-09-12 ENCOUNTER — Encounter (HOSPITAL_COMMUNITY): Payer: Self-pay | Admitting: Pharmacy Technician

## 2012-09-12 ENCOUNTER — Telehealth: Payer: Self-pay | Admitting: *Deleted

## 2012-09-12 ENCOUNTER — Ambulatory Visit (HOSPITAL_COMMUNITY): Payer: MEDICAID

## 2012-09-12 VITALS — BP 122/77 | HR 114 | Temp 97.0°F | Ht 69.0 in | Wt 155.1 lb

## 2012-09-12 DIAGNOSIS — Z3049 Encounter for surveillance of other contraceptives: Secondary | ICD-10-CM

## 2012-09-12 DIAGNOSIS — Z3042 Encounter for surveillance of injectable contraceptive: Secondary | ICD-10-CM

## 2012-09-12 DIAGNOSIS — Z09 Encounter for follow-up examination after completed treatment for conditions other than malignant neoplasm: Secondary | ICD-10-CM

## 2012-09-12 DIAGNOSIS — Z4889 Encounter for other specified surgical aftercare: Secondary | ICD-10-CM

## 2012-09-12 MED ORDER — MEDROXYPROGESTERONE ACETATE 150 MG/ML IM SUSP
150.0000 mg | INTRAMUSCULAR | Status: DC
  Administered 2012-09-12: 150 mg via INTRAMUSCULAR

## 2012-09-12 MED ORDER — IBUPROFEN 600 MG PO TABS: 600.0000 mg | ORAL_TABLET | Freq: Four times a day (QID) | ORAL | Status: DC | PRN

## 2012-09-12 MED ORDER — OXYCODONE-ACETAMINOPHEN 5-325 MG PO TABS: 1.0000 | ORAL_TABLET | ORAL | Status: DC | PRN

## 2012-09-12 MED ORDER — DOCUSATE SODIUM 100 MG PO CAPS: 100.0000 mg | ORAL_CAPSULE | Freq: Two times a day (BID) | ORAL | Status: DC | PRN

## 2012-09-12 NOTE — Patient Instructions (Signed)
Return to clinic for any obstetric concerns or go to [facility] for evaluation  

## 2012-09-12 NOTE — Telephone Encounter (Signed)
Patient recently had csection delivery of twins. She is having trouble with her incision. Lots of pain and bleeding/drainage from incision. She would like to have someone take a look at it. I advised that she come in this afternoon for an incision check at 2pm. Pt is agreeable with plan.

## 2012-09-12 NOTE — Progress Notes (Signed)
GYNECOLOGY CLINIC PROGRESS NOTE  History:  27 y.o. [id number] here today for incision check s/p cesarean section for twins at [redacted]w[redacted]d after she presented in labor; history of classical cesarean section.  She reports incisional pain and drainage, wants something for pain.  Bottlefeeding, desires Depo Provera for contraception.  The following portions of the patient's history were reviewed and updated as appropriate: allergies, current medications, past family history, past medical history, past social history, past surgical history and problem list.  Review of Systems:  Pertinent items are noted in HPI.  Objective:  Physical Exam BP 122/77  Pulse 114  Temp(Src) 97 F (36.1 C) (Oral)  Ht 5\' 9"  (1.753 m)  Wt 155 lb 1.6 oz (70.353 kg)  BMI 22.89 kg/m2  LMP 12/12/2011  Breastfeeding? No Gen: NAD Incision: No drainage, induration, erythema noted.  Healing well, has some dried Dermabond still present. Pelvic: Deferred  Assessment & Plan:  Percocet prescribed, encouraged to take Ibuprofen also for incisional pain Depo Provera to be given today Return for 6 week postpartum check

## 2012-09-23 ENCOUNTER — Other Ambulatory Visit (HOSPITAL_COMMUNITY): Payer: Medicaid Other

## 2012-09-25 ENCOUNTER — Inpatient Hospital Stay (HOSPITAL_COMMUNITY)
Admission: AD | Admit: 2012-09-25 | Payer: Medicaid Other | Source: Ambulatory Visit | Admitting: Obstetrics & Gynecology

## 2012-09-25 ENCOUNTER — Encounter (HOSPITAL_COMMUNITY): Admission: AD | Payer: Self-pay | Source: Ambulatory Visit

## 2012-09-25 SURGERY — Surgical Case
Anesthesia: Regional | Site: Abdomen

## 2012-10-17 ENCOUNTER — Ambulatory Visit: Payer: Medicaid Other | Admitting: Obstetrics & Gynecology

## 2012-11-28 ENCOUNTER — Ambulatory Visit: Payer: Medicaid Other

## 2012-12-11 ENCOUNTER — Ambulatory Visit: Payer: Medicaid Other | Admitting: Obstetrics & Gynecology

## 2012-12-24 ENCOUNTER — Telehealth: Payer: Self-pay | Admitting: *Deleted

## 2012-12-24 NOTE — Telephone Encounter (Signed)
Pt left message stating that she delivered twins on 09/01/12. She has missed several appointments but is concerned about the fact that she has never stopped bleeding since delivery of the babies. I called pt after chart review and left message that her concern will be evaluated at her appt next week. If she has urgent needs which cannot wait until the appt, she should go to [facility] for evaluation.  **Note: pt received Depo Provera injection on 09/12/12, [facility] for next scheduled injection on 10/2.

## 2013-01-02 ENCOUNTER — Encounter: Payer: Self-pay | Admitting: Family Medicine

## 2013-01-02 ENCOUNTER — Ambulatory Visit (INDEPENDENT_AMBULATORY_CARE_PROVIDER_SITE_OTHER): Payer: Medicaid Other | Admitting: Family Medicine

## 2013-01-02 ENCOUNTER — Other Ambulatory Visit: Payer: Self-pay

## 2013-01-02 VITALS — BP 115/83 | HR 97 | Temp 97.7°F | Ht 69.0 in | Wt 170.4 lb

## 2013-01-02 DIAGNOSIS — N920 Excessive and frequent menstruation with regular cycle: Secondary | ICD-10-CM

## 2013-01-02 DIAGNOSIS — N938 Other specified abnormal uterine and vaginal bleeding: Secondary | ICD-10-CM

## 2013-01-02 DIAGNOSIS — N939 Abnormal uterine and vaginal bleeding, unspecified: Secondary | ICD-10-CM | POA: Insufficient documentation

## 2013-01-02 DIAGNOSIS — N949 Unspecified condition associated with female genital organs and menstrual cycle: Secondary | ICD-10-CM

## 2013-01-02 DIAGNOSIS — N898 Other specified noninflammatory disorders of vagina: Secondary | ICD-10-CM

## 2013-01-02 LAB — CBC WITH DIFFERENTIAL/PLATELET
Eosinophils Absolute: 0.1 10*3/uL (ref 0.0–0.7)
Eosinophils Relative: 1 % (ref 0–5)
HCT: 39.5 % (ref 36.0–46.0)
Lymphs Abs: 2.9 10*3/uL (ref 0.7–4.0)
MCH: 32 pg (ref 26.0–34.0)
MCV: 90.2 fL (ref 78.0–100.0)
Monocytes Absolute: 0.6 10*3/uL (ref 0.1–1.0)
Platelets: 256 10*3/uL (ref 150–400)
RBC: 4.38 MIL/uL (ref 3.87–5.11)

## 2013-01-02 MED ORDER — MEDROXYPROGESTERONE ACETATE 104 MG/0.65ML ~~LOC~~ SUSP
104.0000 mg | Freq: Once | SUBCUTANEOUS | Status: AC
  Administered 2013-01-02: 104 mg via SUBCUTANEOUS

## 2013-01-02 MED ORDER — MELOXICAM 15 MG PO TABS: 15.0000 mg | ORAL_TABLET | Freq: Every day | ORAL | Status: DC

## 2013-01-02 NOTE — Progress Notes (Signed)
Pt. C/o of bleeding continuously since birth of twins in July. States she saturates 4-5 pads a day for the last few months; states it has been consistently heavy the last two months.

## 2013-01-02 NOTE — Progress Notes (Signed)
Subjective:     Patient ID: Laurie Hall, female   DOB: 1985-07-12, 27 y.o.   MRN: [id number]  HPI  Vaginal bleeding: Pt delivered twins, via C-section on 09/01/12 @ [redacted]w[redacted]d weeks gestation d/t labor with history of classical cesarean section with separatoin of vertical uterine incision.  She has missed several appointments but is concerned about the fact that she has never stopped bleeding since delivery of the babies.  Pt received Depo Provera injection on 09/12/12, [facility] for next scheduled injection on 10/2. Pt. C/o of bleeding continuously since birth of twins in July. States she saturates 4-5 pads a day for the last few months; states it has been consistently heavy the last two months with small clot formation. She is having cramping daily that is uncomfortable. She denies shortness of breath, chest pain, palpitations, dizziness or fatigue. She is currently not on any other form of birth control. She is not breast feeding.   Review of Systems Negative, with the exception of above mentioned in HPI  Objective:   Physical Exam BP 115/83  Pulse 97  Temp(Src) 97.7 F (36.5 C) (Oral)  Ht 5\' 9"  (1.753 m)  Wt 170 lb 6.4 oz (77.293 kg)  BMI 25.15 kg/m2  Breastfeeding? No Gen: NAD. Pleasant.  CV: RRR  Chest: CTAB, no wheeze or crackles Abd: Soft.  NTND. BS present. No Masses palpated.  GYN:  External genitalia within normal limits.  Small Vaginal introitus. Mucosa pink, moist, normal rugae.  Nonfriable cervix without lesions, moderate bright red bleeding on speculum exam.  Bimanual exam revealed normal, nongravid uterus.  No cervical motion tenderness. No adnexal masses bilaterally.    Assessment/Plan:  Laurie Hall is 27 y.o. [id number] post C-section for twin delivery on 09/01/2012.   Vaginal bleeding: Probable post delivery bleeding with progesterone withdraw d/t missing depo provera shot. CBC, POCT urine preg negative today. Restart depo provera birth control. Discussed possible  unpredictable bleeding pattern over the next few months with the restart of Depo provera. Patient is in understanding.  Mobic for pain.  F/U: Pending lab results. Red flags discussed.

## 2013-01-02 NOTE — Progress Notes (Signed)
I have seen and examined this patient and agree with above documentation in the resident's note. Pt with hx of significant bleeding now slowing some but on exam only mild bleeding in the vaginal vault. Normal size uterus on bimanual. Discussed options to stop bleeding and encouraged LARC vs OCP but pt would really like to try depo again. CBC obtained given hx of bleeding. Last value was 7.6 although pt was transfused after this.  F/u in 2 months  Rulon Abide, M.D. [facility] Fellow 01/02/2013 4:39 PM

## 2013-01-20 ENCOUNTER — Ambulatory Visit: Payer: Medicaid Other

## 2013-03-26 ENCOUNTER — Ambulatory Visit (INDEPENDENT_AMBULATORY_CARE_PROVIDER_SITE_OTHER): Payer: Medicaid Other

## 2013-03-26 ENCOUNTER — Inpatient Hospital Stay (HOSPITAL_COMMUNITY)
Admission: AD | Admit: 2013-03-26 | Discharge: 2013-03-26 | Disposition: A | Discharge status: Home / Self Care | Payer: Medicaid Other | Source: Ambulatory Visit | Attending: Obstetrics & Gynecology | Admitting: Obstetrics & Gynecology

## 2013-03-26 ENCOUNTER — Inpatient Hospital Stay (HOSPITAL_COMMUNITY): Payer: Medicaid Other

## 2013-03-26 ENCOUNTER — Encounter (HOSPITAL_COMMUNITY): Payer: Self-pay | Admitting: *Deleted

## 2013-03-26 VITALS — BP 124/77 | HR 100 | Temp 97.8°F | Ht 69.0 in | Wt 182.5 lb

## 2013-03-26 DIAGNOSIS — F17200 Nicotine dependence, unspecified, uncomplicated: Secondary | ICD-10-CM | POA: Insufficient documentation

## 2013-03-26 DIAGNOSIS — Z3049 Encounter for surveillance of other contraceptives: Secondary | ICD-10-CM

## 2013-03-26 DIAGNOSIS — G8929 Other chronic pain: Secondary | ICD-10-CM | POA: Insufficient documentation

## 2013-03-26 DIAGNOSIS — N898 Other specified noninflammatory disorders of vagina: Secondary | ICD-10-CM | POA: Insufficient documentation

## 2013-03-26 DIAGNOSIS — N949 Unspecified condition associated with female genital organs and menstrual cycle: Secondary | ICD-10-CM | POA: Insufficient documentation

## 2013-03-26 DIAGNOSIS — R109 Unspecified abdominal pain: Secondary | ICD-10-CM | POA: Insufficient documentation

## 2013-03-26 DIAGNOSIS — R102 Pelvic and perineal pain: Secondary | ICD-10-CM

## 2013-03-26 LAB — URINALYSIS, ROUTINE W REFLEX MICROSCOPIC
Bilirubin Urine: NEGATIVE
Glucose, UA: NEGATIVE mg/dL
Hgb urine dipstick: NEGATIVE
KETONES UR: NEGATIVE mg/dL
LEUKOCYTES UA: NEGATIVE
NITRITE: NEGATIVE
PROTEIN: NEGATIVE mg/dL
Specific Gravity, Urine: >1.03 — ABNORMAL HIGH (ref 1.005–1.030)
Urobilinogen, UA: 0.2 mg/dL (ref 0.0–1.0)
pH: 6 (ref 5.0–8.0)

## 2013-03-26 LAB — WET PREP, GENITAL
TRICH WET PREP: NONE SEEN
YEAST WET PREP: NONE SEEN

## 2013-03-26 LAB — POCT PREGNANCY, URINE: PREG TEST UR: NEGATIVE

## 2013-03-26 MED ORDER — MEDROXYPROGESTERONE ACETATE 104 MG/0.65ML ~~LOC~~ SUSP
104.0000 mg | Freq: Once | SUBCUTANEOUS | Status: AC
  Administered 2013-03-26: 104 mg via SUBCUTANEOUS

## 2013-03-26 NOTE — Discharge Instructions (Signed)
Abdominal Pain, Women °Abdominal (stomach, pelvic, or belly) pain can be caused by many things. It is important to tell your doctor: °· The location of the pain. °· Does it come and go or is it present all the time? °· Are there things that start the pain (eating certain foods, exercise)? °· Are there other symptoms associated with the pain (fever, nausea, vomiting, diarrhea)? °All of this is helpful to know when trying to find the cause of the pain. °CAUSES  °· Stomach: virus or bacteria infection, or ulcer. °· Intestine: appendicitis (inflamed appendix), regional ileitis (Crohn's disease), ulcerative colitis (inflamed colon), irritable bowel syndrome, diverticulitis (inflamed diverticulum of the colon), or cancer of the stomach or intestine. °· Gallbladder disease or stones in the gallbladder. °· Kidney disease, kidney stones, or infection. °· Pancreas infection or cancer. °· Fibromyalgia (pain disorder). °· Diseases of the female organs: °· Uterus: fibroid (non-cancerous) tumors or infection. °· Fallopian tubes: infection or tubal pregnancy. °· Ovary: cysts or tumors. °· Pelvic adhesions (scar tissue). °· Endometriosis (uterus lining tissue growing in the pelvis and on the pelvic organs). °· Pelvic congestion syndrome (female organs filling up with blood just before the menstrual period). °· Pain with the menstrual period. °· Pain with ovulation (producing an egg). °· Pain with an IUD (intrauterine device, birth control) in the uterus. °· Cancer of the female organs. °· Functional pain (pain not caused by a disease, may improve without treatment). °· Psychological pain. °· Depression. °DIAGNOSIS  °Your doctor will decide the seriousness of your pain by doing an examination. °· Blood tests. °· X-rays. °· Ultrasound. °· CT scan (computed tomography, special type of X-ray). °· MRI (magnetic resonance imaging). °· Cultures, for infection. °· Barium enema (dye inserted in the large intestine, to better view it with  X-rays). °· Colonoscopy (looking in intestine with a lighted tube). °· Laparoscopy (minor surgery, looking in abdomen with a lighted tube). °· Major abdominal exploratory surgery (looking in abdomen with a large incision). °TREATMENT  °The treatment will depend on the cause of the pain.  °· Many cases can be observed and treated at home. °· Over-the-counter medicines recommended by your caregiver. °· Prescription medicine. °· Antibiotics, for infection. °· Birth control pills, for painful periods or for ovulation pain. °· Hormone treatment, for endometriosis. °· Nerve blocking injections. °· Physical therapy. °· Antidepressants. °· Counseling with a psychologist or psychiatrist. °· Minor or major surgery. °HOME CARE INSTRUCTIONS  °· Do not take laxatives, unless directed by your caregiver. °· Take over-the-counter pain medicine only if ordered by your caregiver. Do not take aspirin because it can cause an upset stomach or bleeding. °· Try a clear liquid diet (broth or water) as ordered by your caregiver. Slowly move to a bland diet, as tolerated, if the pain is related to the stomach or intestine. °· Have a thermometer and take your temperature several times a day, and record it. °· Bed rest and sleep, if it helps the pain. °· Avoid sexual intercourse, if it causes pain. °· Avoid stressful situations. °· Keep your follow-up appointments and tests, as your caregiver orders. °· If the pain does not go away with medicine or surgery, you may try: °· Acupuncture. °· Relaxation exercises (yoga, meditation). °· Group therapy. °· Counseling. °SEEK MEDICAL CARE IF:  °· You notice certain foods cause stomach pain. °· Your home care treatment is not helping your pain. °· You need stronger pain medicine. °· You want your IUD removed. °· You feel faint or   lightheaded. °· You develop nausea and vomiting. °· You develop a rash. °· You are having side effects or an allergy to your medicine. °SEEK IMMEDIATE MEDICAL CARE IF:  °· Your  pain does not go away or gets worse. °· You have a fever. °· Your pain is felt only in portions of the abdomen. The right side could possibly be appendicitis. The left lower portion of the abdomen could be colitis or diverticulitis. °· You are passing blood in your stools (bright red or black tarry stools, with or without vomiting). °· You have blood in your urine. °· You develop chills, with or without a fever. °· You pass out. °MAKE SURE YOU:  °· Understand these instructions. °· Will watch your condition. °· Will get help right away if you are not doing well or get worse. °Document Released: 12/11/2006 Document Revised: 05/08/2011 Document Reviewed: 12/31/2008 °Exit[employer]® Patient Information ©2014 [employer]. ° °

## 2013-03-26 NOTE — MAU Provider Note (Signed)
History     CSN: [id number]  Arrival date and time: 03/26/13 1034   First Provider Initiated Contact with Patient 03/26/13 1148      Chief Complaint  Patient presents with  . Abdominal Pain  . Vaginal Discharge   HPI  Laurie Hall is a 28 yo [id number] who presents with lower abdominal cramping x 7 months.  Pt reports that the pain has been present ever since her c-section in July.  The is 8/10, radiates to the back and is relieved with sleep.  Pt endorses light vaginal discharge that is yellow in color.  Pt denies vaginal bleeding and urinary symptoms.  OB History   Grav Para Term Preterm Abortions TAB SAB Ect Mult Living   7 5 1 4 2 1 1  0 1 5      Past Medical History  Diagnosis Date  . Urinary tract infection   . Meningitis   . Ovarian cyst   . Abnormal Pap smear     x4, repeats ok  . Heart palpitations     Past Surgical History  Procedure Laterality Date  . Cesarean section    . Therapeutic abortion    . Cesarean section N/A 09/01/2012    Procedure: Repeat cesarean section with delivery of Baby A girl at 1559. Baby B boy at 1600.;  Surgeon: Lesly Dukes, MD;  Location: [facility];  Service: Obstetrics;  Laterality: N/A;    Family History  Problem Relation Age of Onset  . Other Neg Hx   . Hyperthyroidism Mother   . Heart disease Mother   . Diabetes Mother   . Hyperthyroidism Maternal Aunt   . Hypothyroidism Maternal Grandmother   . Heart murmur Maternal Grandmother   . Diabetes Maternal Grandmother   . Hyperthyroidism Maternal Grandfather   . Heart disease Maternal Grandfather     History  Substance Use Topics  . Smoking status: Current Every Day Smoker -- 1.00 packs/day for 8 years    Types: Cigarettes  . Smokeless tobacco: Never Used  . Alcohol Use: No     Comment: social    Allergies: No Known Allergies  Facility-administered medications prior to admission  Medication Dose Route Frequency Provider Last Rate Last Dose  . medroxyPROGESTERone  (DEPO-PROVERA) injection 150 mg  150 mg Intramuscular Q90 days Tereso Newcomer, MD   150 mg at 09/12/12 1440   Prescriptions prior to admission  Medication Sig Dispense Refill  . diphenhydrAMINE (BENADRYL) 25 MG tablet Take 25 mg by mouth every 6 (six) hours as needed for itching or allergies.        Review of Systems  Constitutional: Negative for fever and chills.  Eyes: Negative for blurred vision and double vision.  Respiratory: Negative for cough.   Cardiovascular: Negative for chest pain.  Gastrointestinal: Positive for abdominal pain. Negative for nausea, vomiting, diarrhea and constipation.  Genitourinary: Negative for dysuria, urgency and frequency.       Yellow vaginal discharge  Musculoskeletal: Negative for myalgias.  Neurological: Negative for dizziness and headaches.   Physical Exam   Blood pressure 118/77, pulse 82, temperature 98 F (36.7 C), temperature source Oral, resp. rate 18.  Physical Exam  Constitutional: She is oriented to person, place, and time. She appears well-developed and well-nourished. No distress.  HENT:  Head: Normocephalic and atraumatic.  Eyes: Pupils are equal, round, and reactive to light.  Neck: Normal range of motion.  Cardiovascular: Normal rate and regular rhythm.   Respiratory: Effort normal and breath sounds  normal.  GI: Bowel sounds are normal. She exhibits no distension. There is tenderness in the right lower quadrant. There is no rebound and no guarding.  Genitourinary:  Speculum exam:  Vagina: white, creamy discharge noted (scant), no erythema, no lesions Cervix:  No discharge noted, no external erythema or lesions.  Bimanual exam:  Uterus:  Non tender Cervix:  No CMT Adnexa: R sided tenderness, no mass. L side no tenderness, no mass. Wet prep and GC/Chlam collected  Chaperone present for exam  Musculoskeletal: Normal range of motion.  Neurological: She is alert and oriented to person, place, and time.  Skin: Skin is  warm.  Psychiatric: She has a normal mood and affect. Her behavior is normal.    Results for orders placed during the hospital encounter of 03/26/13 (from the past 24 hour(s))  URINALYSIS, ROUTINE W REFLEX MICROSCOPIC     Status: Abnormal   Collection Time    03/26/13 10:46 AM      Result Value Range   Color, Urine YELLOW  YELLOW   APPearance CLEAR  CLEAR   Specific Gravity, Urine >1.030 (*) 1.005 - 1.030   pH 6.0  5.0 - 8.0   Glucose, UA NEGATIVE  NEGATIVE mg/dL   Hgb urine dipstick NEGATIVE  NEGATIVE   Bilirubin Urine NEGATIVE  NEGATIVE   Ketones, ur NEGATIVE  NEGATIVE mg/dL   Protein, ur NEGATIVE  NEGATIVE mg/dL   Urobilinogen, UA 0.2  0.0 - 1.0 mg/dL   Nitrite NEGATIVE  NEGATIVE   Leukocytes, UA NEGATIVE  NEGATIVE  POCT PREGNANCY, URINE     Status: None   Collection Time    03/26/13 10:46 AM      Result Value Range   Preg Test, Ur NEGATIVE  NEGATIVE  WET PREP, GENITAL     Status: Abnormal   Collection Time    03/26/13 12:10 PM      Result Value Range   Yeast Wet Prep HPF POC NONE SEEN  NONE SEEN   Trich, Wet Prep NONE SEEN  NONE SEEN   Clue Cells Wet Prep HPF POC MODERATE (*) NONE SEEN   WBC, Wet Prep HPF POC FEW (*) NONE SEEN   US Transvaginal Non-ob  03/26/2013   CLINICAL DATA:  Pelvic pain.  EXAM: TRANSABDOMINAL AND TRANSVAGINAL ULTRASOUND OF PELVIS  TECHNIQUE: Both transabdominal and transvaginal ultrasound examinations of the pelvis were performed. Transabdominal technique was performed for global imaging of the pelvis including uterus, ovaries, adnexal regions, and pelvic cul-de-sac. It was necessary to proceed with endovaginal exam following the transabdominal exam to visualize the ovaries.  COMPARISON:  Pelvic ultrasound 08/15/2012.  FINDINGS: Uterus  Measurements: 8.4 x 5.1 x 3.9 cm. No fibroids or other mass visualized. C-section scar incidentally noted.  Endometrium  Thickness: 7 mm.  No focal abnormality visualized.  Right ovary  Measurements: 3.1 x 2.4 x 2.6 cm.  Normal appearance/no adnexal mass. Multiple tiny follicles.  Left ovary  Measurements: 4.1 x 2.6 x 3.7 cm. Normal appearance/no adnexal mass. Multiple follicles, largest of which measures up to 2 cm in diameter.  Other findings  No free fluid.  IMPRESSION: 1. No acute findings to account for the patient's symptoms. 2. Normal sonographic appearance of the ovaries. 3. C-section scar noted in the uterus. Uterus is otherwise normal in appearance.   Electronically Signed   By: Trudie Reed M.D.   On: 03/26/2013 13:21   US Pelvis Complete  03/26/2013   CLINICAL DATA:  Pelvic pain.  EXAM: TRANSABDOMINAL AND TRANSVAGINAL  ULTRASOUND OF PELVIS  TECHNIQUE: Both transabdominal and transvaginal ultrasound examinations of the pelvis were performed. Transabdominal technique was performed for global imaging of the pelvis including uterus, ovaries, adnexal regions, and pelvic cul-de-sac. It was necessary to proceed with endovaginal exam following the transabdominal exam to visualize the ovaries.  COMPARISON:  Pelvic ultrasound 08/15/2012.  FINDINGS: Uterus  Measurements: 8.4 x 5.1 x 3.9 cm. No fibroids or other mass visualized. C-section scar incidentally noted.  Endometrium  Thickness: 7 mm.  No focal abnormality visualized.  Right ovary  Measurements: 3.1 x 2.4 x 2.6 cm. Normal appearance/no adnexal mass. Multiple tiny follicles.  Left ovary  Measurements: 4.1 x 2.6 x 3.7 cm. Normal appearance/no adnexal mass. Multiple follicles, largest of which measures up to 2 cm in diameter.  Other findings  No free fluid.  IMPRESSION: 1. No acute findings to account for the patient's symptoms. 2. Normal sonographic appearance of the ovaries. 3. C-section scar noted in the uterus. Uterus is otherwise normal in appearance.   Electronically Signed   By: Trudie Reedaniel  Entrikin M.D.   On: 03/26/2013 13:21     MAU Course  Procedures  MDM  Pelvic exam Pelvic US GC/Chlam Wet prep   Assessment and Plan   Assessment:  Chronic pelvic  pain ? etiology  P: Pt unwilling to take medications Is on Depo awaiting 3rd injection Ultrasound negative [facility]   Toilolo, Tifi 03/26/2013, 11:55 AM

## 2013-03-26 NOTE — MAU Note (Signed)
Had C/S for twins in July, has been having cramping ever since, worse at night.  Discharge for last 2 weeks, yellow, no odor or itching.  Had depo shot in clinic this a.m.

## 2013-03-27 LAB — GC/CHLAMYDIA PROBE AMP
CT Probe RNA: NEGATIVE
GC Probe RNA: NEGATIVE

## 2013-06-18 ENCOUNTER — Ambulatory Visit: Payer: Medicaid Other

## 2013-12-29 ENCOUNTER — Encounter (HOSPITAL_COMMUNITY): Payer: Self-pay | Admitting: *Deleted

## 2014-10-30 IMAGING — US US OB EACH ADDL GEST<[ID]
1 series · 12 of 28 positions shown · non-contrast
Comparison: none

[Series 1: us ob comp less 14 wks · 12 of 45 slices shown]
[im 2/45]
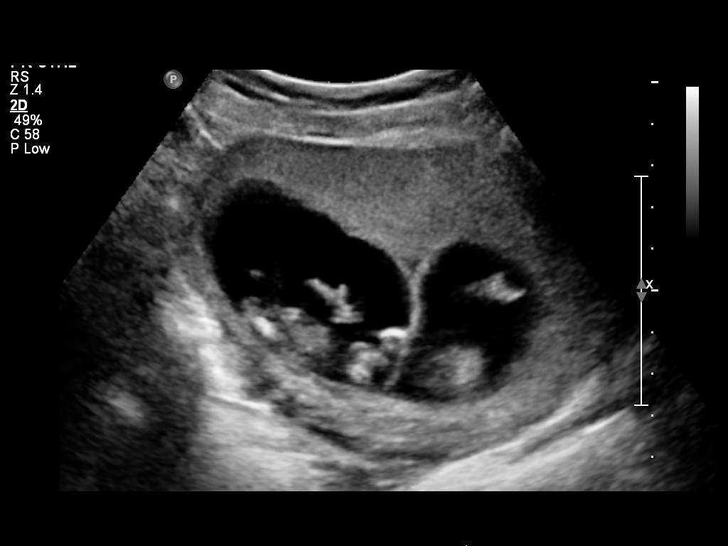
[im 5/45]
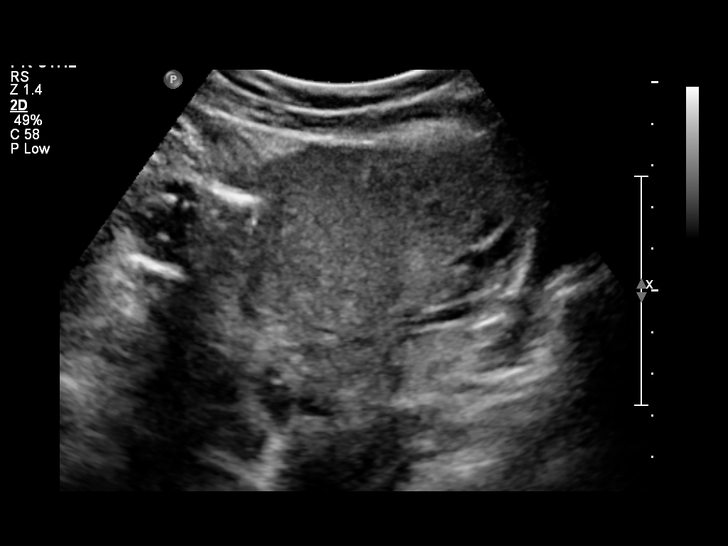
[im 9/45]
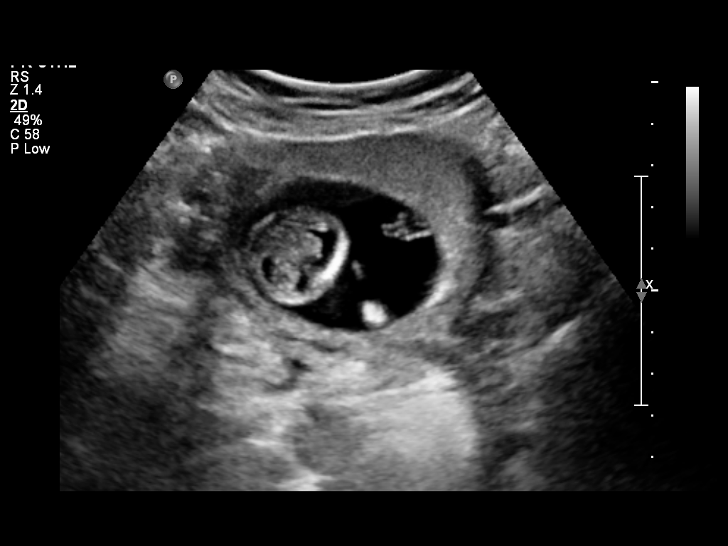
[im 14/45]
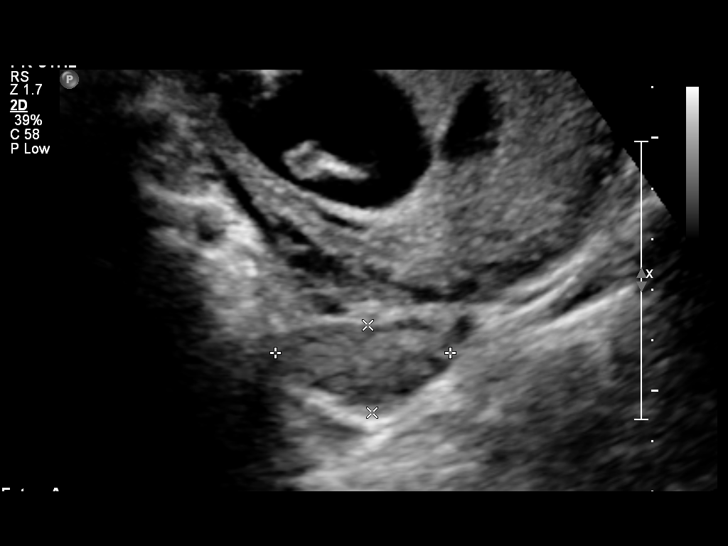
[im 17/45]
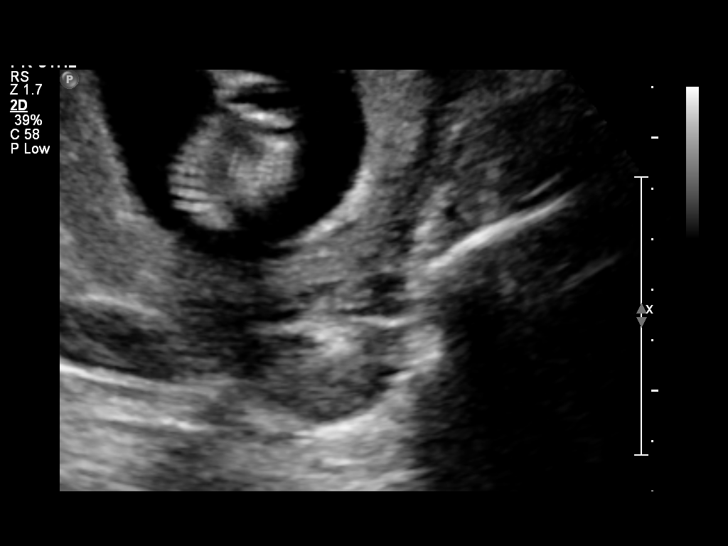
[im 20/45]
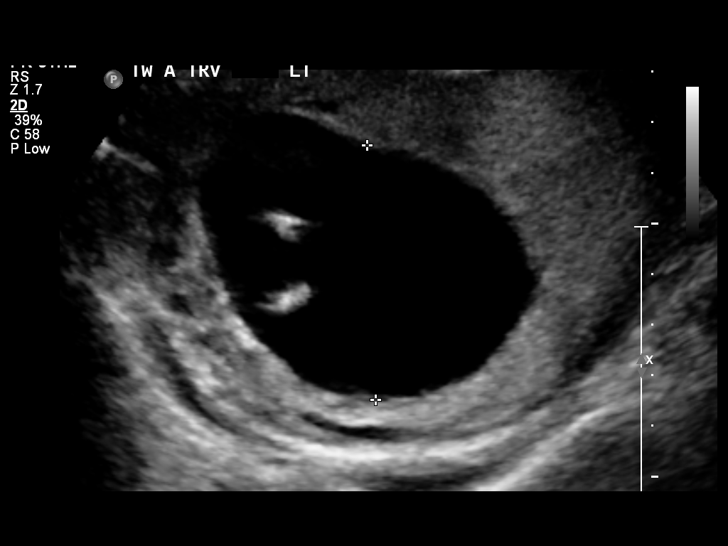
[im 25/45]
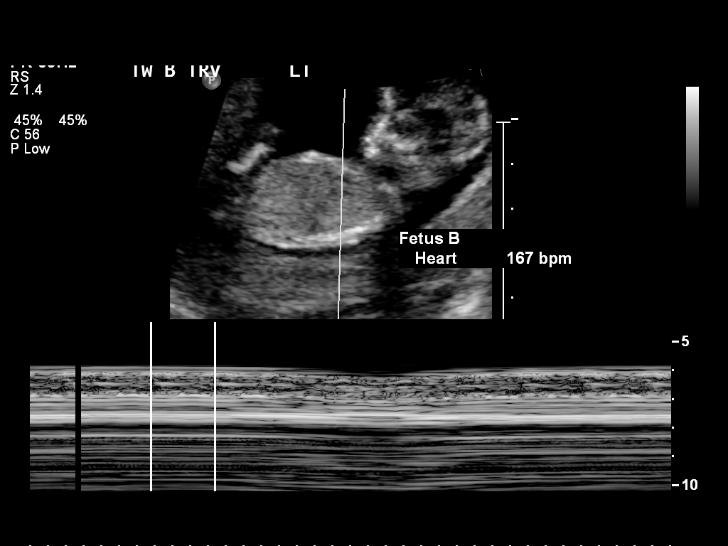
[im 28/45]
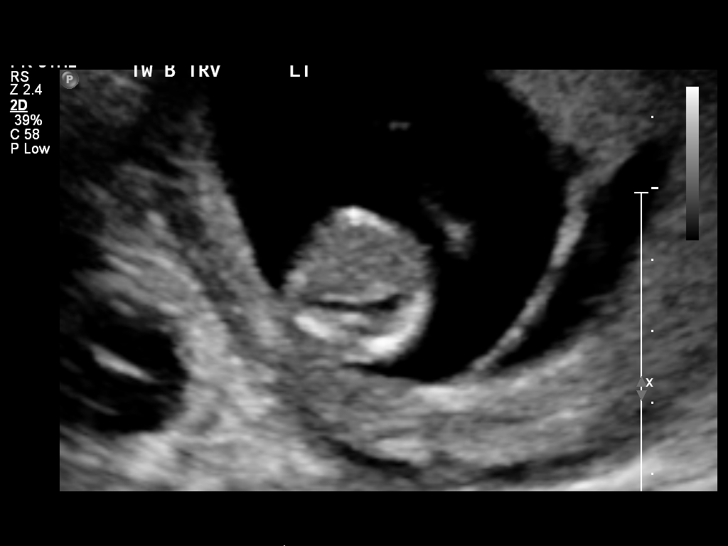
[im 31/45]
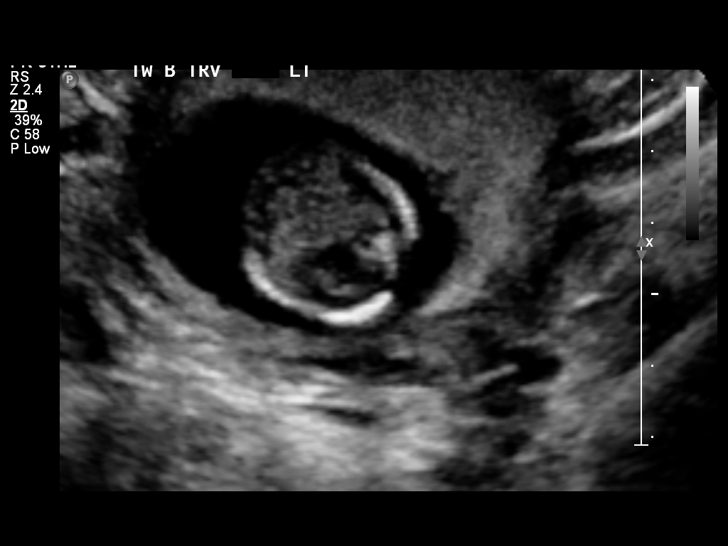
[im 36/45]
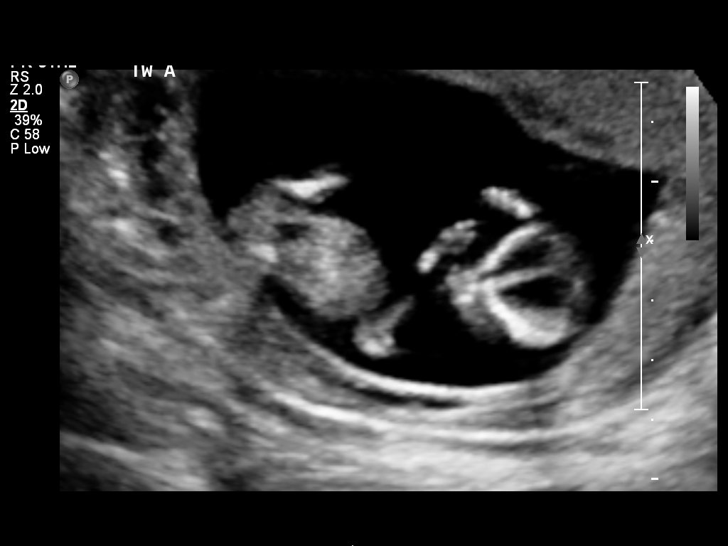
[im 40/45]
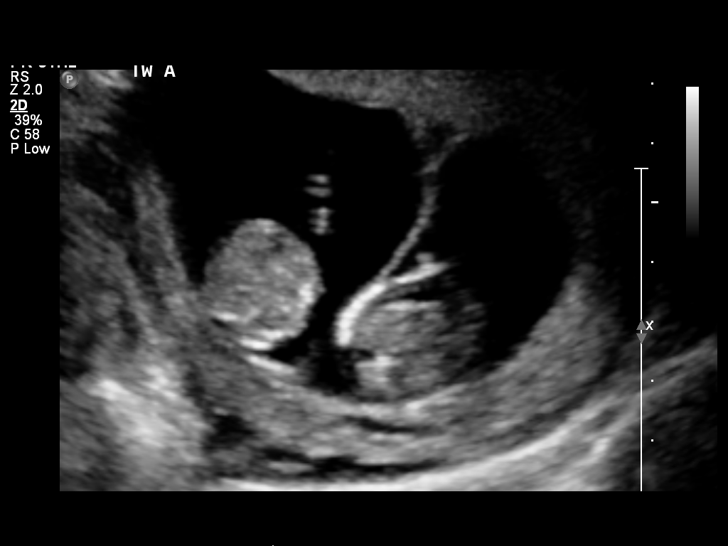
[im 43/45]
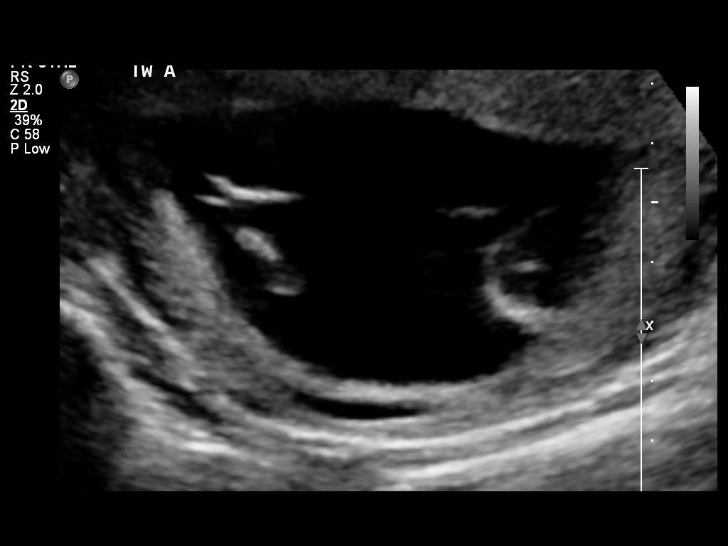

[12 of 28 positions shown; findings below may reference images not displayed]

OBSTETRICS REPORT
                      (Signed Final 04/16/2012 [DATE])

Service(s) Provided

 US OB COMP LESS 14 WKS                                76801.0
 US OB COMP ADDL GEST LESS 14 WKS                      76802.0
Indications

 Vaginal bleeding, unknown etiology
 Previous pre-term deliveries x 2
 Previous cesarean section
 No or Little Prenatal Care
Fetal Evaluation (Fetus A)

 Num Of Fetuses:    2
 Fetal Heart Rate:  163                         bpm
 Cardiac Activity:  Observed
 Fetal Lie:         Lower Fetus
 Presentation:      Transverse, head to
                    maternal left
 Placenta:          Anterior

 Membrane Desc:     Dividing
                    Membrane seen
                    - Dichorionic.

 Amniotic Fluid
 AFI FV:      Subjectively within normal limits
Biometry (Fetus A)

 CRL:       66  mm    G. Age:   12w 5d                 EDD:   10/24/12
Gestational Age (Fetus A)

 Best:          12w 6d    Det. By:   C R L Avg. (03/06/12)    EDD:   10/23/12
Anatomy (Fetus A)

 Cranium:          Appears normal         Abdominal Wall:   Appears nml (cord
                                                            insert, abd wall)
 Fetal Cavum:      Appears normal         Bladder:          Appears normal
 Choroid Plexus:   Appears normal         Lower             Appears normal
                                          Extremities:
 Stomach:          Appears normal, left   Upper             Appears normal
                   sided                  Extremities:
 Abdomen:          Appears normal
 Other:  Technically difficult due to early GA.

Fetal Evaluation (Fetus B)

 Num Of Fetuses:    2
 Fetal Heart Rate:  167                         bpm
 Cardiac Activity:  Observed
 Fetal Lie:         Upper Fetus
 Presentation:      Transverse, head to
                    maternal left
 Placenta:          Anterior
 P. Cord            Visualized
 Insertion:

 Amniotic Fluid
 AFI FV:      Subjectively within normal limits
Biometry (Fetus B)

 CRL:     65.8  mm    G. Age:   12w 5d                 EDD:   10/24/12
Gestational Age (Fetus B)

 Best:          12w 6d    Det. By:   C R L Avg. (03/06/12)    EDD:   10/23/12
Anatomy (Fetus B)

 Cranium:          Appears normal         Abdominal Wall:   Appears nml (cord
                                                            insert, abd wall)
 Choroid Plexus:   Appears normal         Bladder:          Appears normal
 Stomach:          Appears normal, left   Lower             Appears normal
                   sided                  Extremities:
 Abdomen:          Appears normal         Upper             Appears normal
                                          Extremities:

 Other:  Technically difficult due to early GA.
Cervix Uterus Adnexa

 Uterus:       No abnormality visualized.
 Cul De Sac:   No free fluid seen.

 Left Ovary:   Size(cm) L: 3.44 x W: 2.12 x H: 1.72  Volume(cc):
 Right Ovary:  Size(cm) L: 2.61 x W: 2.67 x H: 1.4  Volume(cc):
Comments

 This study was performed as an on call procedure and was
 initially reviewed by Dr. Jova Elder on 04/16/2012.
Impression

 Diamniotic dichorionic twin gestation with both twins having
 an EGA by CRL of 12w 5d. This correlates well with expected
 EGA by early ultrasound of 12w 6d.

 Visualized early anatomic structures appear normal on both
 twins.

 No subchorionic hemorrhage seen. Normal ovaries.

## 2014-12-05 IMAGING — US US OB TRANSVAGINAL
1 series · 12 of 28 positions shown · non-contrast
Comparison: none

OBSTETRICS REPORT
                      (Signed Final 05/22/2012 [DATE])

Service(s) Provided
 US OB DETAIL + 14 WK                                  76811.0
 US OB DETAIL ADDL GEST + 14 WK                        76811.1
 US OB TRANSVAGINAL                                    76817.0
Indications
 Previous pre-term deliveries x 2
 Previous cesarean section (classical)
 Poor obstetric history: Previous IUFD (stillbirth)
 Detailed fetal anatomic survey
 Twin gestation, Di-Di
Fetal Evaluation (Fetus A)
 Num Of Fetuses:    2
 Fetal Heart Rate:  171                          bpm
 Cardiac Activity:  Observed
 Fetal Lie:         Left Fetus
 Presentation:      Cephalic
 Placenta:          Anterior, above cervical os
 P. Cord            Visualized
 Insertion:
 Membrane Desc:     Dividing
                    Membrane seen
                    - Dichorionic.
 Amniotic Fluid
 AFI FV:      Subjectively within normal limits
                                             Larg Pckt:     4.1  cm
Biometry (Fetus A)
 BPD:     43.1  mm     G. Age:  19w 0d                CI:         79.8   70 - 86
 OFD:       54  mm                                    FL/HC:      17.5   15.8 -
                                                                         18
 HC:     155.7  mm     G. Age:  18w 3d       67  %    HC/AC:      1.21   1.07 -
 AC:     128.4  mm     G. Age:  18w 3d       61  %    FL/BPD:
 FL:      27.2  mm     G. Age:  18w 2d       56  %    FL/AC:      21.2   20 - 24
 HUM:     26.8  mm     G. Age:  18w 4d       70  %
 CER:     18.7  mm     G. Age:  18w 2d       61  %
 NFT:      2.1  mm
 Est. FW:     238  gm      0 lb 8 oz     56  %     FW Discordancy     0 \ 13 %
Gestational Age (Fetus A)
 LMP:           23w 1d        Date:  12/12/11                 EDD:   09/17/12
 U/S Today:     18w 4d                                        EDD:   10/19/12
 Best:          18w 0d     Det. By:  U/S C R L Fetus A        EDD:   10/23/12
                                     (03/06/12)
Anatomy (Fetus A)
 Cranium:          Appears normal         Aortic Arch:      Appears normal
 Fetal Cavum:      Appears normal         Ductal Arch:      Appears normal
 Ventricles:       Appears normal         Diaphragm:        Not well visualized
 Choroid Plexus:   Appears normal         Stomach:          Appears normal, left
                                                            sided
 Cerebellum:       Appears normal         Abdomen:          Appears normal
 Posterior Fossa:  Appears normal         Abdominal Wall:   Appears nml (cord
                                                            insert, abd wall)
 Nuchal Fold:      Not well visualized    Cord Vessels:     Appears normal (3
                                                            vessel cord)
 Face:             Profile appears        Kidneys:          Appear normal
                   normal
 Lips:             Appears normal         Bladder:          Appears normal
 Palate:           Not well visualized    Spine:            Not well visualized
 Heart:            Not well visualized    Lower             Appears normal
                                          Extremities:
 RVOT:             Not well visualized    Upper             Appears normal
 LVOT:             Appears normal
 Other:  Fetus appears to be a female. Heels and 5th digit appear normal.
Targeted Anatomy (Fetus A)
 Fetal Central Nervous System
 Cisterna Magna:
Fetal Evaluation (Fetus B)
 Fetal Heart Rate:  155                          bpm
 Fetal Lie:         Right Fetus
 Presentation:      Variable
                                             Larg Pckt:     5.3  cm
Biometry (Fetus B)
 BPD:     38.7  mm     G. Age:  17w 6d                CI:         73.9   70 - 86
 OFD:     52.4  mm                                    FL/HC:      16.7   15.8 -
 HC:     146.3  mm     G. Age:  17w 6d       32  %    HC/AC:      1.19   1.07 -
 AC:     123.3  mm     G. Age:  18w 0d       47  %    FL/BPD:
 FL:      24.5  mm     G. Age:  17w 3d       23  %    FL/AC:      19.9   20 - 24
 HUM:     24.6  mm     G. Age:  17w 5d       46  %
 CER:     17.8  mm     G. Age:  17w 6d       46  %
 NFT:      3.2  mm
 Est. FW:     206  gm      0 lb 7 oz     43  %     FW Discordancy        13  %
Gestational Age (Fetus B)
 U/S Today:     17w 6d                                        EDD:   10/24/12
Anatomy (Fetus B)
 Fetal Cavum:      Not well visualized    Ductal Arch:      Not well visualized
 Ventricles:       Ventriculomegaly,      Diaphragm:        Not well visualized
                   10.5 mm
 Nuchal Fold:      Appears normal         Cord Vessels:     Appears normal (3
 Face:             Appears normal         Kidneys:          Not well visualized
                   (orbits and profile)
 Lips:             Not well visualized    Bladder:          Appears normal
 Palate:           Not well visualized    Spine:            Appears normal
 Heart:            Appears normal         Lower             Appears normal
                   (4CH, axis, and        Extremities:
                   situs)
 Other:  Fetus appears to be a male. Heels and 5th digit appear normal.
Targeted Anatomy (Fetus B)
Cervix Uterus Adnexa
 Cervical Length:    4.1      cm
 Cervix:       Normal appearance by transvaginal scan Appears
               closed, without funnelling.
 Left Ovary:    Not visualized. No adnexal mass visualized.
 Right Ovary:   Within normal limits.
Impression
INDICATION: 26 yr old [id number] at 12w7d with
 dichorionic/diamniotic twin gestation with history of preterm
 delivery for fetal anatomic survey.

[Series 1: us ob transvaginal · 0.19mm/px · 12 of 165 slices shown]
[im 7/165]
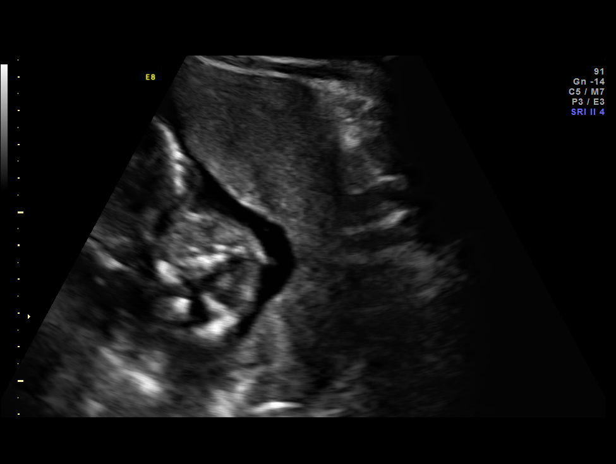
[im 19/165]
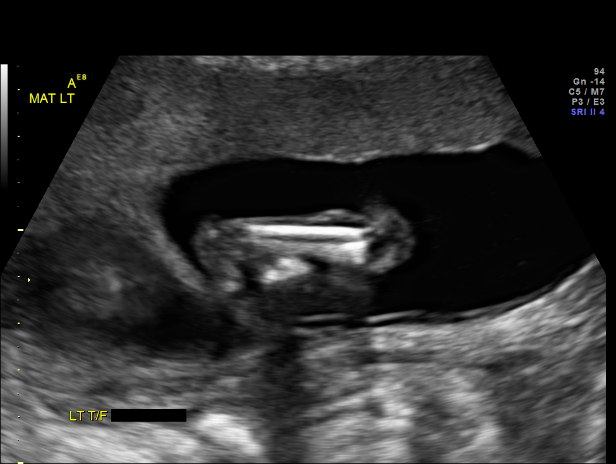
[im 31/165]
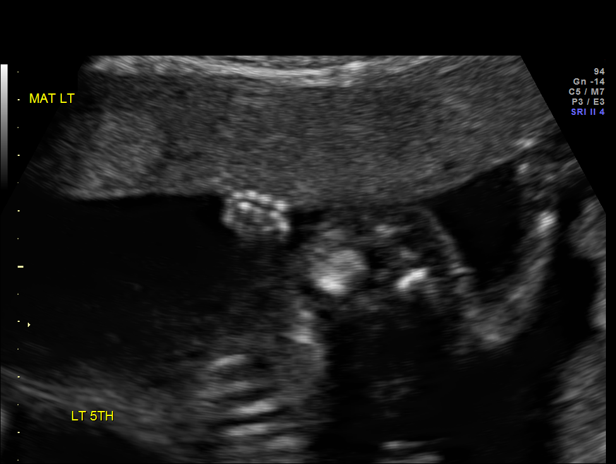
[im 49/165]
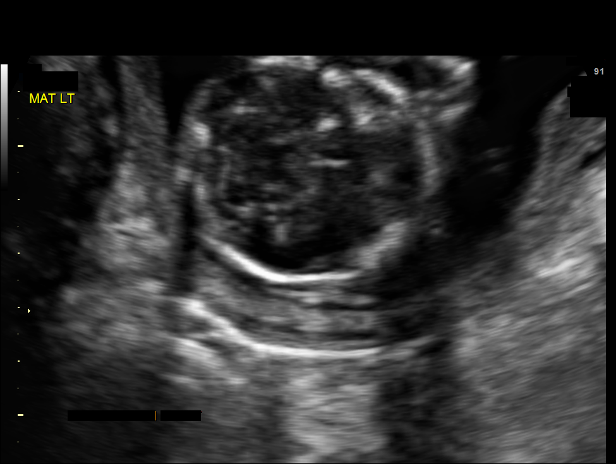
[im 61/165]
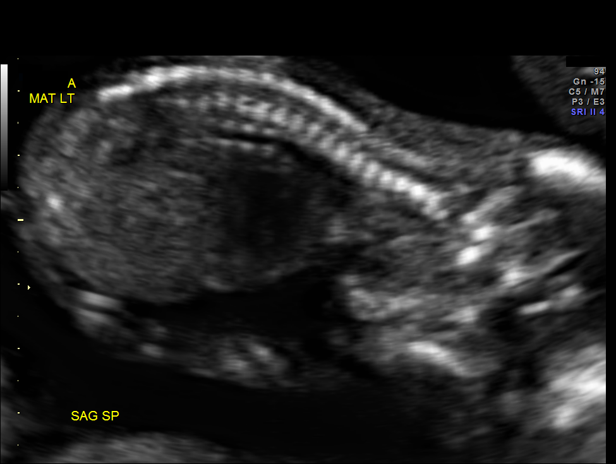
[im 73/165]
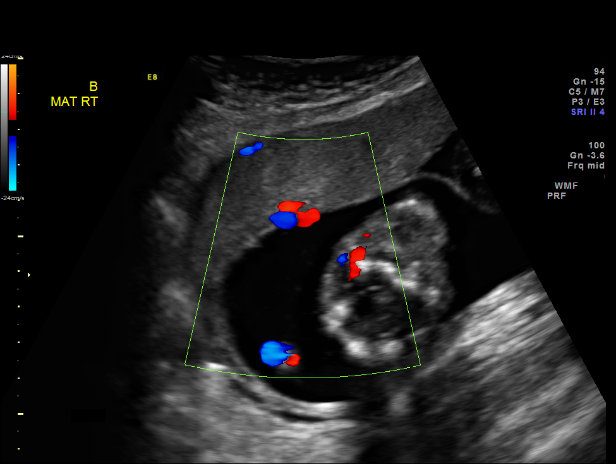
[im 92/165]
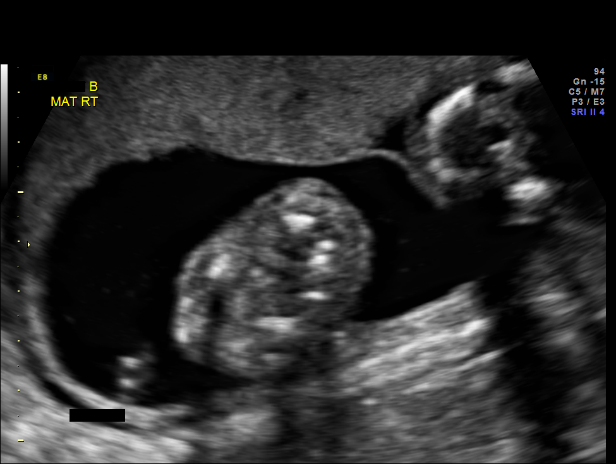
[im 104/165]
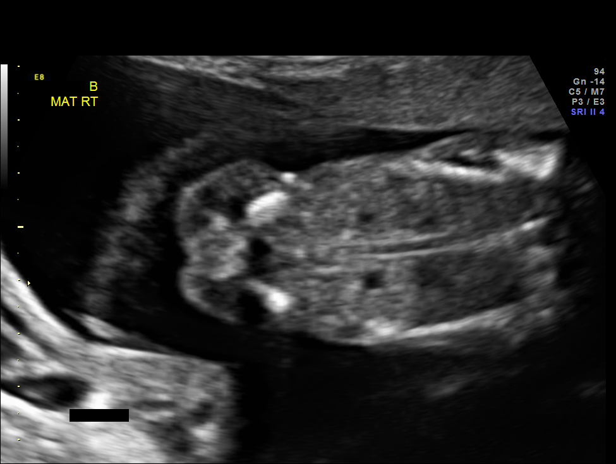
[im 116/165]
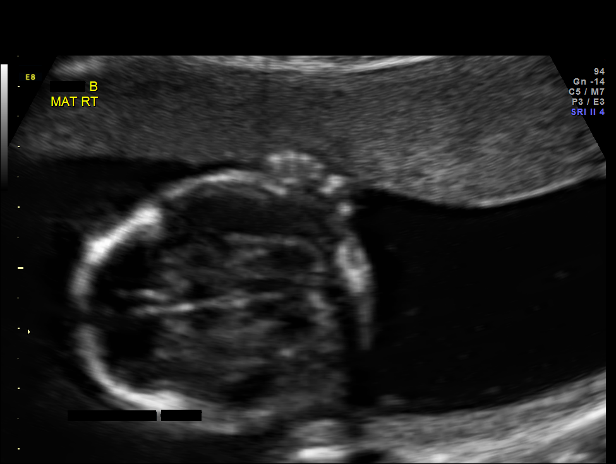
[im 134/165]
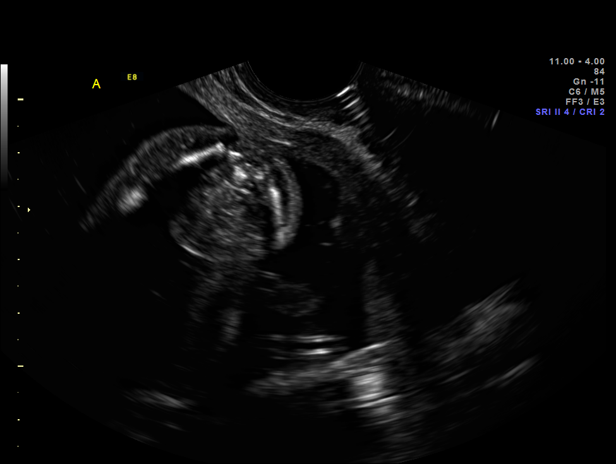
[im 146/165]
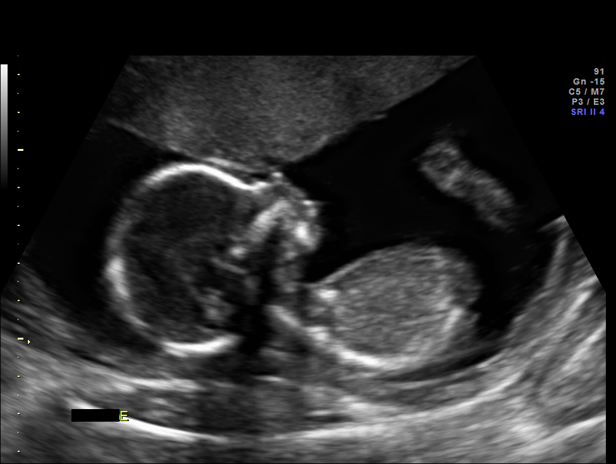
[im 158/165]
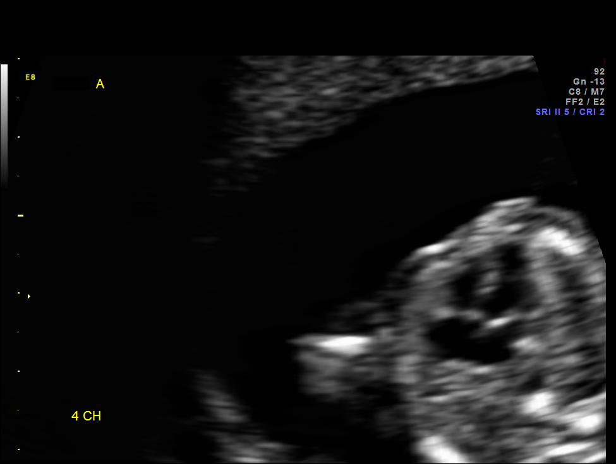

[12 of 28 positions shown; findings below may reference images not displayed]

Fidings:
 1. Dichorionic/diamniotic twin gestation; the dividing
 membrane is seen.
 2. Fetal biometry for both fetuses is consistent with dating.
 The fetuses are concordant.
 3. Both placentas are anterior without evidence of previa.
 4. Normal amniotic fluid volume for both fetuses.
 5. Normal transvaginal cervical length.
 6. Twin A: views of the nuchal fold, palate, orbits, diaphragm,
 spine, heart, and ankles are limited.
 7. The remainder of the limited anatomy survey for twin A is
 normal.
 8. Twin B: the views of the cavum, palate, nose/lips,
 diaphragm, heart, kidneys, and ankles are limited.
 9. Twin B has ventriculomegaly; the lateral ventricles
 measure 10.5mm.
 10. The remainder of the limited anatomy survey for twin B is
 normal.
Recommendations

 1. Twin pregnancy:
 - discussed increased risk of preterm labor/PPROM/preterm
 cervical dilation and preterm delivery- average age of delivery
 is 35 weeks with dichorionic twins
 - would recommend given her increased risk of preterm
 delivery to perform cervical length surveillance every 2 weeks
 until 26 or 28 weeks gestation
 - discussed increased risk of fetal growth restriction- will
 follow fetal growth every 4 weeks
 - discussed increased risk of maternal complications
 including gestational diabetes and preeclampsia as well as
 need for C section (although with history of classical C
 section patient will need to delivery via C section)
 2. Previous preterm deliveries:
 I discussed that with a history of preterm delivery the risk is
 increased in future pregnancies. Studies have shown that the
 frequency of recurrent preterm birth was 14 to 22 % after one
 preterm delivery, 28 to 42 % after two preterm deliveries, and
 67% after three preterm deliveries. Term births decreased the
 risk of preterm birth in subsequent pregnancies.
 I explained that I would recommend weekly injections of 17
 OH progesterone as use from 16-36 weeks has been shown
 to decrease the risk of recurrent preterm birth by up to 40%. I
 discussed we do not know of any adverse effects of
 progesterone however long term data is limited. Patient is in
 agree [facility] and is starting progesterone injections this week.
 Discussed data with prior preterm birth and current twin
 pregnancy is more sparse and the level of benefit is not
 known; however given the potential for benefit would still
 recommend 17OH progesterone.

 I discussed that her cervical length is normal on today's
 ultrasound. I recommend close surveillance for development
 of signs/symptoms of preterm labor/PPROM. Recommend
 follow up ultrasound in 2 weeks for cervical length. I would
 recommend serial cervical lengths until at least 26 weeks
 gestation.

 3. Prior classical C section: patient will be delivered by repeat
 C section at 36 weeks gestation. Discussed risk of uterine
 scar dehisence
 4. Ventriculomegaly in twin B:
 - discussed possible association with fetal aneuploidy- risk is
 approximately 4-14%- patient declined amniocentesis; had
 normal quad screen; I discussed option of cell free fetal DNA
 screening and its limitations in detecting fetal aneuploidy-
 patient opted for this screening which was drawn today
 - also discussed possible association with congenital
 infections- given mild ventriculomegaly recommend
 reevaluate in 2 weeks and if persists recommend obtain
 serology for toxoplasmosis, CMV, and parvovirus IgG and IgM
 - discussed association with neural tube defects: the spine
 and posterior fossa appear normal on today's exam
 - discussed association with narrowing of ducts in the
 ventricular system (obstructive hydrocephalus)
 - discussed association with adverse neurodevelopmental
 outcomes including neurologic, motor, and cognitive
 impairment
 I discussed risk of adverse outcomes is correlated to level of
 ventriculomegaly. I discussed that at this time the
 ventriculomegaly is mild and if the ventricles remain less than
 12 mm the prognosis if favorable with a greater than 90%
 survival rate and greater than 80% chance of normal
 neurodevelopmental outcomes. I explained even with mild
 ventriculomegaly there is still an approximately 10% chance
 of mild delays and 10% chance of moderate to severe delays.
 I explained with progressing ventriculomegaly the neonate
 may require a ventriculo peritoneal shunt which have risks of
 infection and increase risk of adverse neurodevelopmental
 outcomes.

## 2015-01-17 IMAGING — US US OB TRANSVAGINAL
1 series · 13 of 13 positions shown · non-contrast
Comparison: none

[Series 1: us ob transvaginal · 0.23mm/px · 13 of 13 slices shown]
[im 1/13]
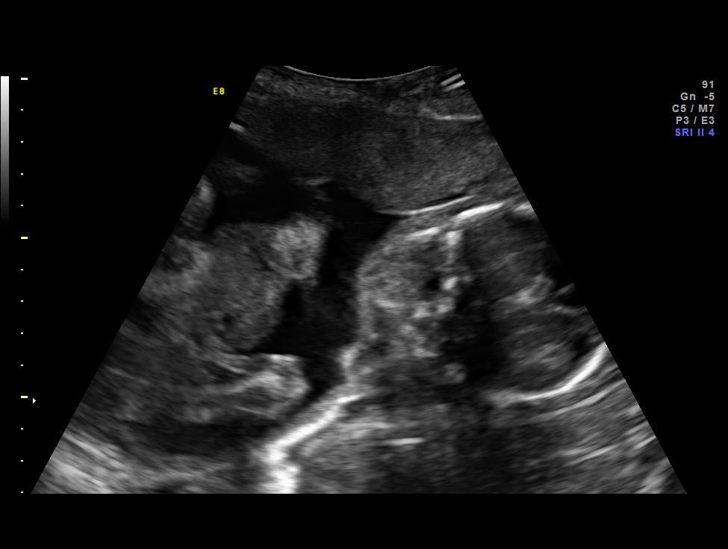
[im 2/13]
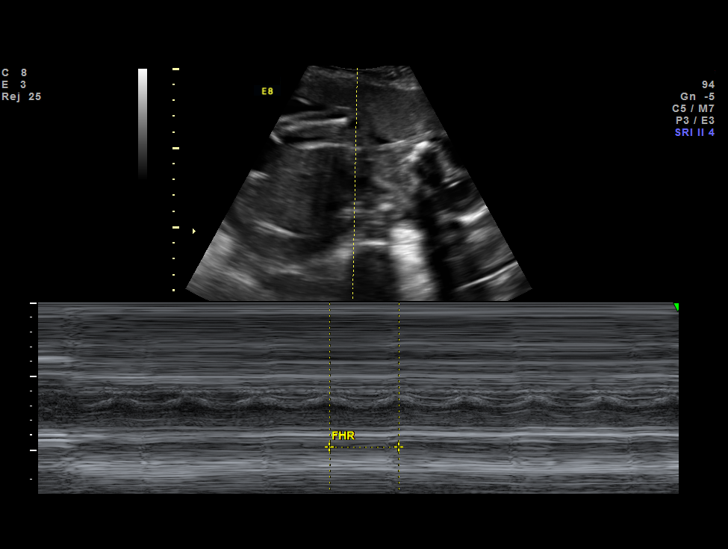
[im 3/13]
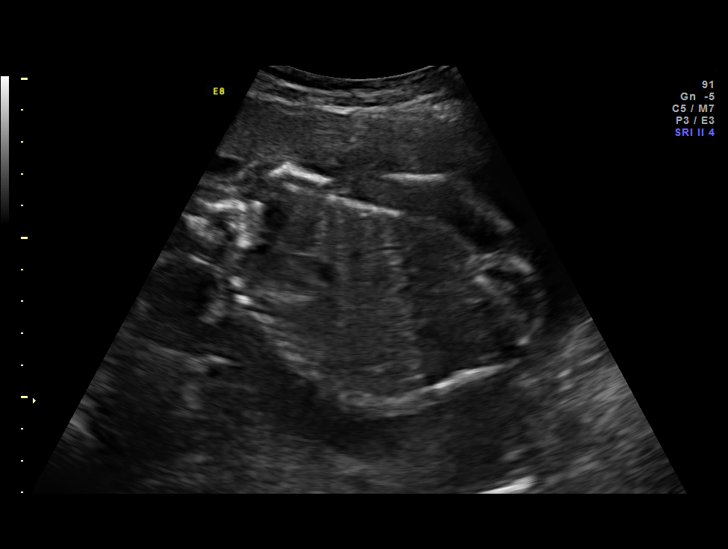
[im 4/13]
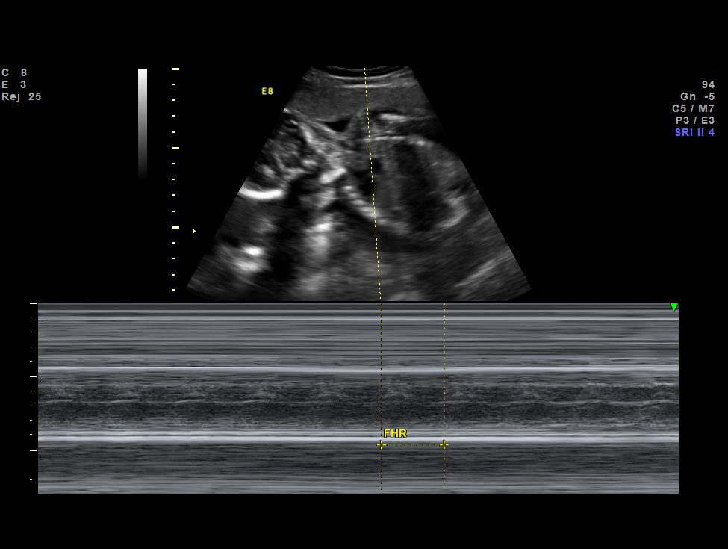
[im 5/13]
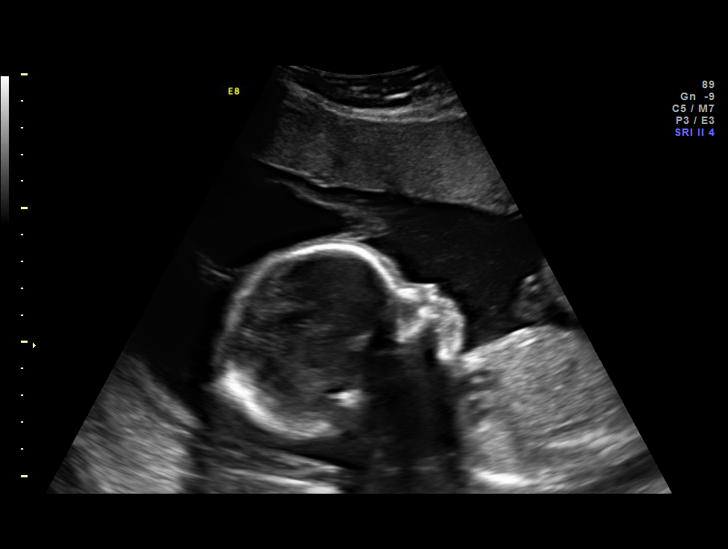
[im 6/13]
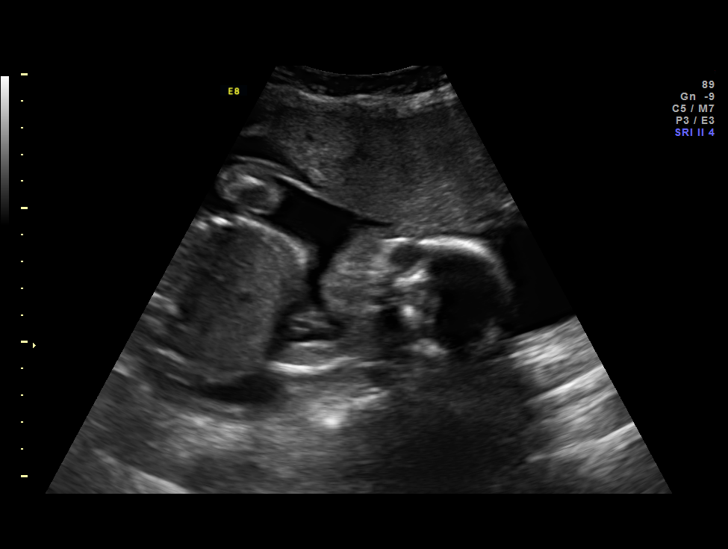
[im 7/13]
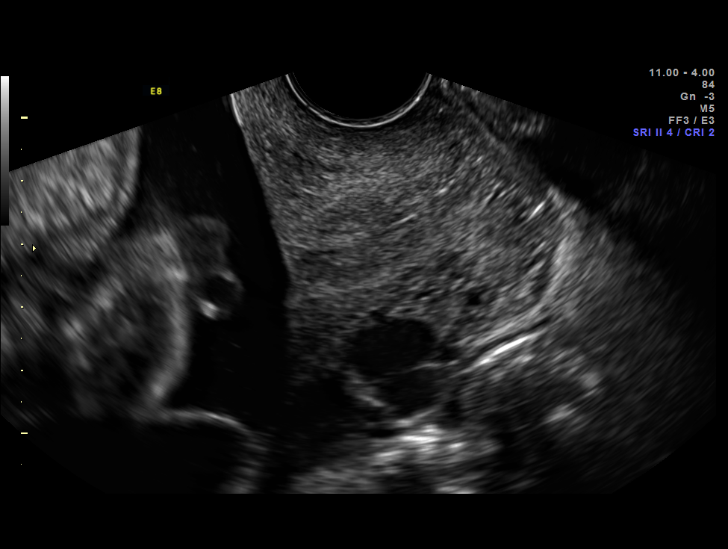
[im 8/13]
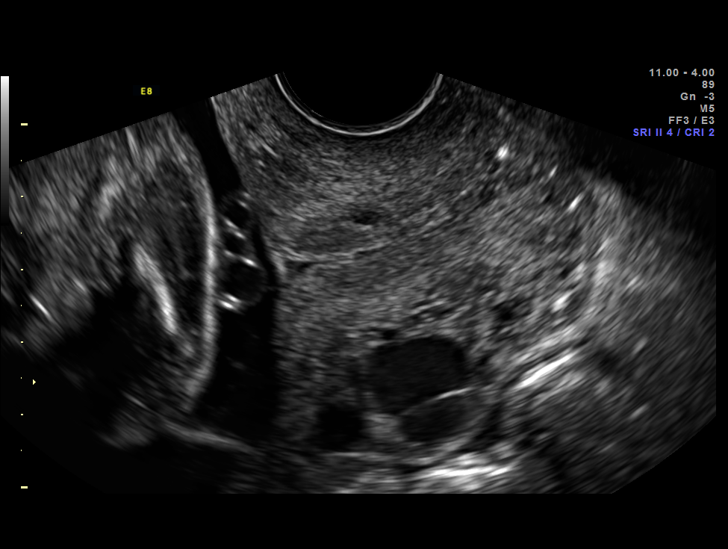
[im 9/13]
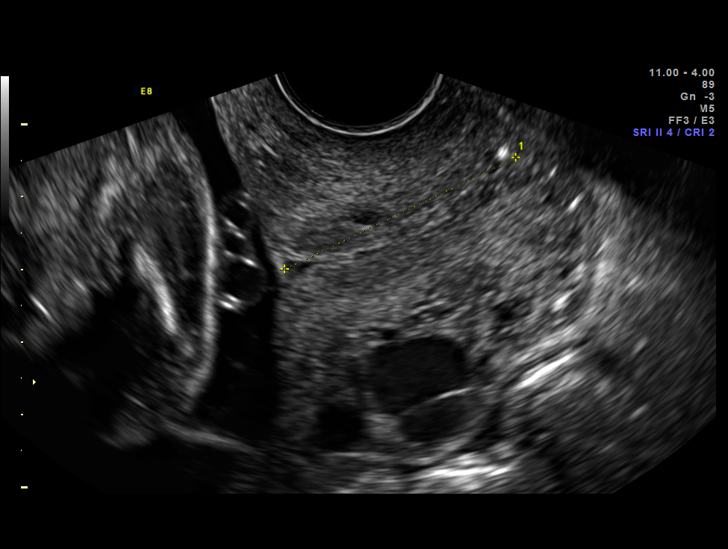
[im 10/13]
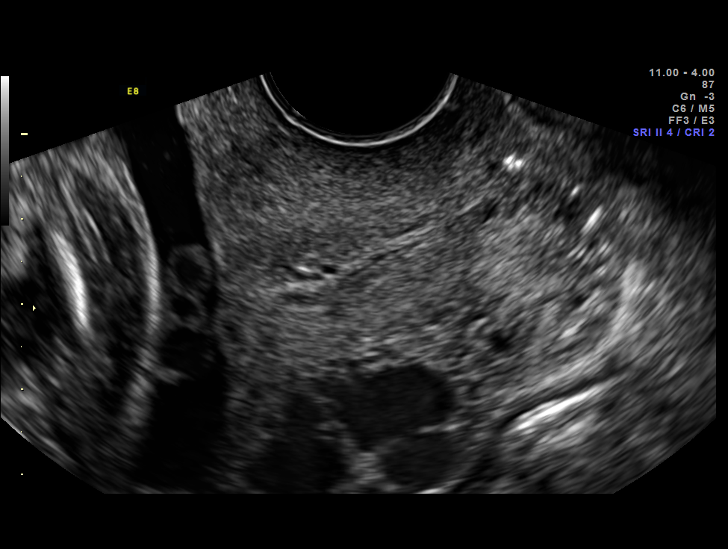
[im 11/13]
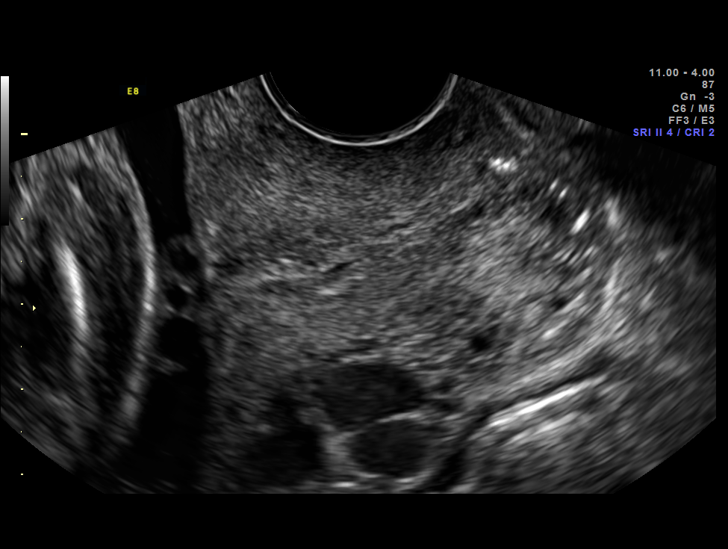
[im 12/13]
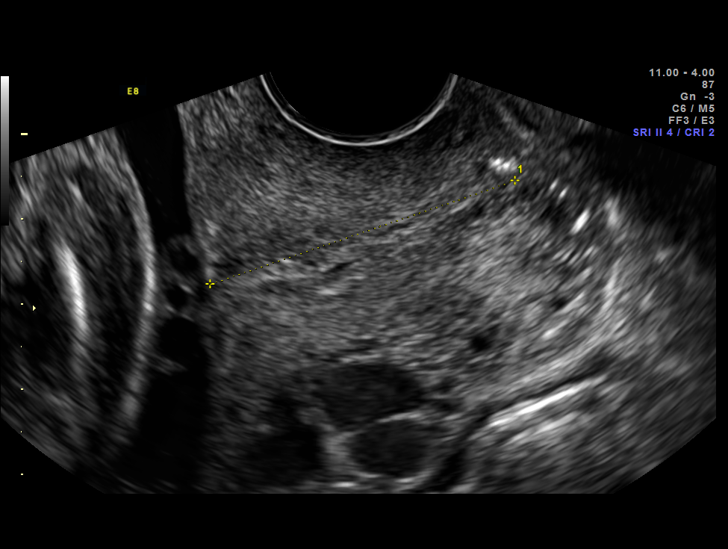
[im 13/13]
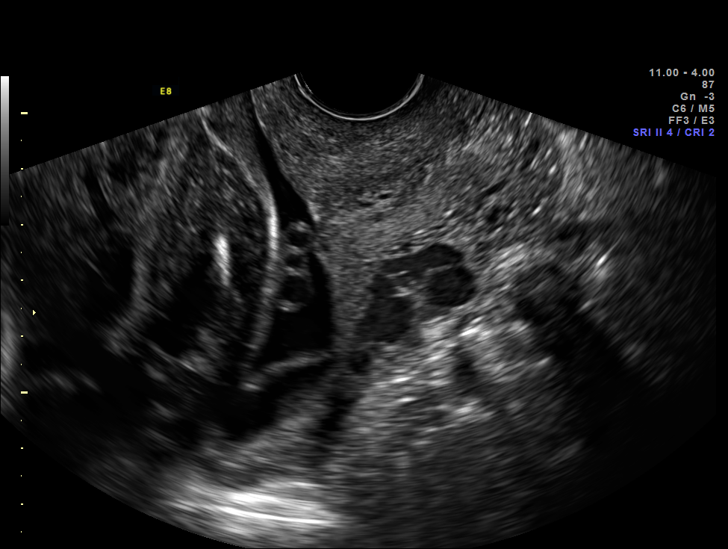

[13 of 13 positions shown; findings below may reference images not displayed]

OBSTETRICS REPORT
                      (Signed Final 07/04/2012 [DATE])

Service(s) Provided

 US OB TRANSVAGINAL                                    76817.0
Indications

 Previous pre-term deliveries x 2
 Previous cesarean section (classical)
 Poor obstetric history: Previous IUFD (stillbirth)
 Twin gestation, Di-Di
 Twin B: mild ventriculomegaly - low risk NIPS; nl
 viral studies
Fetal Evaluation (Fetus A)

 Num Of Fetuses:    2
 Fetal Heart Rate:  161                          bpm
 Cardiac Activity:  Observed
 Fetal Lie:         Girl Fetus
 Presentation:      Breech
 Placenta:          Anterior, above cervical os
 P. Cord            Previously Visualized
 Insertion:

 Membrane Desc:     Dividing
                    Membrane seen
                    - Dichorionic.

 Amniotic Fluid
 AFI FV:      Subjectively within normal limits
Gestational Age (Fetus A)

 LMP:           29w 2d        Date:  12/12/11                 EDD:   09/17/12
 Best:          24w 1d     Det. By:  U/S C R L Fetus A        EDD:   10/23/12
                                     (03/06/12)

Fetal Evaluation (Fetus B)

 Num Of Fetuses:    2
 Fetal Heart Rate:  145                          bpm
 Cardiac Activity:  Observed
 Fetal Lie:         Boy Fetus
 Presentation:      Transverse, head to
                    maternal left
 Placenta:          Anterior, above cervical os
 P. Cord            Previously Visualized
 Insertion:

 Membrane Desc:     Dividing
                    Membrane seen
                    - Dichorionic.

 Amniotic Fluid
 AFI FV:      Subjectively within normal limits
Gestational Age (Fetus B)

 LMP:           29w 2d        Date:  12/12/11                 EDD:   09/17/12
 Best:          24w 1d     Det. By:  U/S C R L Fetus A        EDD:   10/23/12
                                     (03/06/12)
Cervix Uterus Adnexa

 Cervical Length:    3.7      cm

 Cervix:       Normal appearance by transvaginal scan; appears
               closed, without funnelling.
Impression

 Dichorionic/diamniotic twin pregnancy at 24+1 weeks
 EV views of cervix: normal length without funneling
Recommendations

 Follow-up ultrasound for reassessment of Twin B's vents,
 growth and CL in 2 weeks

## 2015-05-31 ENCOUNTER — Emergency Department
Admission: EM | Admit: 2015-05-31 | Discharge: 2015-05-31 | Disposition: A | Discharge status: Home / Self Care | Payer: Medicaid Other | Attending: Emergency Medicine | Admitting: Emergency Medicine

## 2015-05-31 ENCOUNTER — Encounter: Payer: Self-pay | Admitting: Emergency Medicine

## 2015-05-31 DIAGNOSIS — F17210 Nicotine dependence, cigarettes, uncomplicated: Secondary | ICD-10-CM | POA: Diagnosis not present

## 2015-05-31 DIAGNOSIS — X58XXXA Exposure to other specified factors, initial encounter: Secondary | ICD-10-CM | POA: Insufficient documentation

## 2015-05-31 DIAGNOSIS — M545 Low back pain: Secondary | ICD-10-CM | POA: Diagnosis present

## 2015-05-31 DIAGNOSIS — Y929 Unspecified place or not applicable: Secondary | ICD-10-CM | POA: Insufficient documentation

## 2015-05-31 DIAGNOSIS — S39012A Strain of muscle, fascia and tendon of lower back, initial encounter: Secondary | ICD-10-CM | POA: Insufficient documentation

## 2015-05-31 DIAGNOSIS — E0590 Thyrotoxicosis, unspecified without thyrotoxic crisis or storm: Secondary | ICD-10-CM | POA: Insufficient documentation

## 2015-05-31 DIAGNOSIS — Y999 Unspecified external cause status: Secondary | ICD-10-CM | POA: Diagnosis not present

## 2015-05-31 DIAGNOSIS — Y939 Activity, unspecified: Secondary | ICD-10-CM | POA: Insufficient documentation

## 2015-05-31 LAB — URINALYSIS COMPLETE WITH MICROSCOPIC (ARMC ONLY)
BACTERIA UA: NONE SEEN
Bilirubin Urine: NEGATIVE
Glucose, UA: NEGATIVE mg/dL
Hgb urine dipstick: NEGATIVE
Ketones, ur: NEGATIVE mg/dL
Leukocytes, UA: NEGATIVE
Nitrite: NEGATIVE
PH: 5 (ref 5.0–8.0)
PROTEIN: NEGATIVE mg/dL
Specific Gravity, Urine: 1.019 (ref 1.005–1.030)

## 2015-05-31 LAB — POCT PREGNANCY, URINE: Preg Test, Ur: NEGATIVE

## 2015-05-31 MED ORDER — IBUPROFEN 800 MG PO TABS: 800.0000 mg | ORAL_TABLET | Freq: Three times a day (TID) | ORAL | Status: DC | PRN

## 2015-05-31 MED ORDER — KETOROLAC TROMETHAMINE 60 MG/2ML IM SOLN
60.0000 mg | Freq: Once | INTRAMUSCULAR | Status: AC
  Administered 2015-05-31: 60 mg via INTRAMUSCULAR
  Filled 2015-05-31: qty 2

## 2015-05-31 MED ORDER — IBUPROFEN 50 MG PO CHEW: 50.0000 mg | CHEWABLE_TABLET | Freq: Three times a day (TID) | ORAL | Status: DC | PRN

## 2015-05-31 MED ORDER — TRAMADOL HCL 50 MG PO TABS: 50.0000 mg | ORAL_TABLET | Freq: Four times a day (QID) | ORAL | Status: AC | PRN

## 2015-05-31 MED ORDER — METHOCARBAMOL 750 MG PO TABS: 1500.0000 mg | ORAL_TABLET | Freq: Four times a day (QID) | ORAL | Status: DC

## 2015-05-31 NOTE — ED Notes (Signed)
Reports pain in lower back for several days, worse with movement. Ambulates well.

## 2015-05-31 NOTE — ED Provider Notes (Signed)
[facility] Emergency Department Provider Note  ____________________________________________  Time seen: Approximately 11:19 AM  I have reviewed the triage vital signs and the nursing notes.   HISTORY  Chief Complaint Back Pain  Patient complaining low back pain for 3 days. Patient stated pain increases with movement. Patient denies any radicular component to this pain patient denies any bladder or bowel dysfunction. No palliative measures taken for this complaint. Patient stated pain has increased since onset 3 days ago. He is rating the pain as a 10 over 10. Patient described a pain shot. Patient denies history of back pain.  HPI Laurie Hall is a 30 y.o. female    Past Medical History  Diagnosis Date  . Urinary tract infection   . Meningitis   . Ovarian cyst   . Abnormal Pap smear     x4, repeats ok  . Heart palpitations     Patient Active Problem List   Diagnosis Date Noted  . Vaginal bleeding 01/02/2013  . Threatened preterm labor, antepartum 08/13/2012  . Suspected fetal anomaly, antepartum-Ventriculomegaly in Twin B 05/23/2012  . Rubella non-immune status, antepartum 05/09/2012  . Subclinical hyperthyroidism 05/08/2012  . Twin pregnancy, antepartum 05/02/2012  . Previous cesarean delivery, antepartum condition or complication 05/02/2012  . Pregnancy with history of pre-term labor 05/02/2012  . Abnormal weight loss 05/02/2012  . TOBACCO DEPENDENCE 04/26/2006    Past Surgical History  Procedure Laterality Date  . Cesarean section    . Therapeutic abortion    . Cesarean section N/A 09/01/2012    Procedure: Repeat cesarean section with delivery of Baby A girl at 1559. Baby B boy at 1600.;  Surgeon: Lesly Dukes, MD;  Location: [facility];  Service: Obstetrics;  Laterality: N/A;    Current Outpatient Rx  Name  Route  Sig  Dispense  Refill  . diphenhydrAMINE (BENADRYL) 25 MG tablet   Oral   Take 25 mg by mouth every 6 (six) hours as  needed for itching or allergies.         Marland Kitchen ibuprofen (ADVIL,MOTRIN) 50 MG chewable tablet   Oral   Chew 1 tablet (50 mg total) by mouth every 8 (eight) hours as needed for fever.   24 tablet   2   . methocarbamol (ROBAXIN-750) 750 MG tablet   Oral   Take 2 tablets (1,500 mg total) by mouth 4 (four) times daily.   40 tablet   0   . traMADol (ULTRAM) 50 MG tablet   Oral   Take 1 tablet (50 mg total) by mouth every 6 (six) hours as needed.   20 tablet   0     Allergies Review of patient's allergies indicates no known allergies.  Family History  Problem Relation Age of Onset  . Other Neg Hx   . Hyperthyroidism Mother   . Heart disease Mother   . Diabetes Mother   . Hyperthyroidism Maternal Aunt   . Hypothyroidism Maternal Grandmother   . Heart murmur Maternal Grandmother   . Diabetes Maternal Grandmother   . Hyperthyroidism Maternal Grandfather   . Heart disease Maternal Grandfather     Social History Social History  Substance Use Topics  . Smoking status: Current Every Day Smoker -- 1.00 packs/day for 8 years    Types: Cigarettes  . Smokeless tobacco: Never Used  . Alcohol Use: No     Comment: social    Review of Systems Constitutional: No fever/chills Eyes: No visual changes. ENT: No sore throat.  Cardiovascular: Denies chest pain. Respiratory: Denies shortness of breath. Gastrointestinal: No abdominal pain.  No nausea, no vomiting.  No diarrhea.  No constipation. Genitourinary: Negative for dysuria. Musculoskeletal: Positive for back pain. Skin: Negative for rash. Neurological: Negative for headaches, focal weakness or numbness.    ____________________________________________   PHYSICAL EXAM:  VITAL SIGNS: ED Triage Vitals  Enc Vitals Group     BP 05/31/15 0947 119/76 mmHg     Pulse Rate 05/31/15 0947 78     Resp 05/31/15 0947 16     Temp 05/31/15 0947 98.3 F (36.8 C)     Temp Source 05/31/15 0947 Oral     SpO2 05/31/15 0947 100 %      Weight 05/31/15 0947 203 lb (92.08 kg)     Height 05/31/15 0947 5\' 9"  (1.753 m)     Head Cir --      Peak Flow --      Pain Score 05/31/15 0948 10     Pain Loc --      Pain Edu? --      Excl. in GC? --     Constitutional: Alert and oriented. Well appearing and in no acute distress. Eyes: Conjunctivae are normal. PERRL. EOMI. Head: Atraumatic. Nose: No congestion/rhinnorhea. Mouth/Throat: Mucous membranes are moist.  Oropharynx non-erythematous. Neck: No stridor.  No cervical spine tenderness to palpation. Hematological/Lymphatic/Immunilogical: No cervical lymphadenopathy. Cardiovascular: Normal rate, regular rhythm. Grossly normal heart sounds.  Good peripheral circulation. Respiratory: Normal respiratory effort.  No retractions. Lungs CTAB. Gastrointestinal: Soft and nontender. No distention. No abdominal bruits. No CVA tenderness. Musculoskeletal: No lower extremity tenderness nor edema.  No joint effusions. Neurologic:  Normal speech and language. No gross focal neurologic deficits are appreciated. No gait instability. Skin:  Skin is warm, dry and intact. No rash noted. Psychiatric: Mood and affect are normal. Speech and behavior are normal.  ____________________________________________   LABS (all labs ordered are listed, but only abnormal results are displayed)  Labs Reviewed  URINALYSIS COMPLETEWITH MICROSCOPIC ([facility] ONLY) - Abnormal; Notable for the following:    Color, Urine YELLOW (*)    APPearance CLEAR (*)    Squamous Epithelial / LPF 0-5 (*)    All other components within normal limits  POCT PREGNANCY, URINE  POC URINE PREG, ED   ____________________________________________  EKG   ____________________________________________  RADIOLOGY   ____________________________________________   PROCEDURES  Procedure(s) performed: None  Critical Care performed: No  ____________________________________________   INITIAL IMPRESSION / ASSESSMENT AND PLAN /  ED COURSE  Pertinent labs & imaging results that were available during my care of the patient were reviewed by me and considered in my medical decision making (see chart for details).  Acute lumbar strain. Patient given discharge Instructions. Patient given a prescription for tramadol, Robaxin, and ibuprofen. Patient [facility] condition [facility]. ____________________________________________   FINAL CLINICAL IMPRESSION(S) / ED DIAGNOSES  Final diagnoses:  Lumbar strain, initial encounter      Joni ReiningRonald K Dandre Sisler, PA-C 05/31/15 1130  Emily FilbertJonathan E Williams, MD 06/01/15 [phone]

## 2016-07-31 ENCOUNTER — Emergency Department
Admission: EM | Admit: 2016-07-31 | Discharge: 2016-08-01 | Disposition: A | Discharge status: Home / Self Care | Payer: Medicaid Other | Attending: Dermatology | Admitting: Dermatology

## 2016-07-31 ENCOUNTER — Encounter: Payer: Self-pay | Admitting: *Deleted

## 2016-07-31 DIAGNOSIS — R109 Unspecified abdominal pain: Secondary | ICD-10-CM | POA: Diagnosis not present

## 2016-07-31 DIAGNOSIS — Z5321 Procedure and treatment not carried out due to patient leaving prior to being seen by health care provider: Secondary | ICD-10-CM | POA: Insufficient documentation

## 2016-07-31 DIAGNOSIS — F17210 Nicotine dependence, cigarettes, uncomplicated: Secondary | ICD-10-CM | POA: Insufficient documentation

## 2016-07-31 LAB — CBC
HCT: 41.4 % (ref 35.0–47.0)
Hemoglobin: 14.6 g/dL (ref 12.0–16.0)
MCH: 33.2 pg (ref 26.0–34.0)
MCHC: 35.4 g/dL (ref 32.0–36.0)
MCV: 93.9 fL (ref 80.0–100.0)
PLATELETS: 236 10*3/uL (ref 150–440)
RBC: 4.41 MIL/uL (ref 3.80–5.20)
RDW: 12.5 % (ref 11.5–14.5)
WBC: 7.9 10*3/uL (ref 3.6–11.0)

## 2016-07-31 LAB — COMPREHENSIVE METABOLIC PANEL
ALBUMIN: 4.2 g/dL (ref 3.5–5.0)
ALK PHOS: 63 U/L (ref 38–126)
ALT: 13 U/L — AB (ref 14–54)
AST: 20 U/L (ref 15–41)
Anion gap: 11 (ref 5–15)
BUN: 13 mg/dL (ref 6–20)
CALCIUM: 9.3 mg/dL (ref 8.9–10.3)
CO2: 20 mmol/L — ABNORMAL LOW (ref 22–32)
CREATININE: 0.71 mg/dL (ref 0.44–1.00)
Chloride: 105 mmol/L (ref 101–111)
GFR calc Af Amer: >60 mL/min (ref >60)
GFR calc non Af Amer: >60 mL/min (ref >60)
GLUCOSE: 112 mg/dL — AB (ref 65–99)
Potassium: 3.5 mmol/L (ref 3.5–5.1)
SODIUM: 136 mmol/L (ref 135–145)
Total Bilirubin: 0.4 mg/dL (ref 0.3–1.2)
Total Protein: 7.7 g/dL (ref 6.5–8.1)

## 2016-07-31 LAB — LIPASE, BLOOD: Lipase: 28 U/L (ref 11–51)

## 2016-07-31 NOTE — ED Notes (Signed)
No answer when called from lobby 

## 2016-07-31 NOTE — ED Triage Notes (Signed)
Pt complains of abdominal pain for 3 months worse today with nausea, pt denies any other symptoms

## 2017-01-23 ENCOUNTER — Other Ambulatory Visit: Payer: Self-pay

## 2017-01-23 ENCOUNTER — Emergency Department
Admission: EM | Admit: 2017-01-23 | Discharge: 2017-01-23 | Disposition: A | Discharge status: Home / Self Care | Payer: Medicaid Other | Attending: Emergency Medicine | Admitting: Emergency Medicine

## 2017-01-23 DIAGNOSIS — K0889 Other specified disorders of teeth and supporting structures: Secondary | ICD-10-CM | POA: Diagnosis not present

## 2017-01-23 DIAGNOSIS — R51 Headache: Secondary | ICD-10-CM | POA: Insufficient documentation

## 2017-01-23 DIAGNOSIS — F17210 Nicotine dependence, cigarettes, uncomplicated: Secondary | ICD-10-CM | POA: Insufficient documentation

## 2017-01-23 DIAGNOSIS — Z79899 Other long term (current) drug therapy: Secondary | ICD-10-CM | POA: Insufficient documentation

## 2017-01-23 DIAGNOSIS — R519 Headache, unspecified: Secondary | ICD-10-CM

## 2017-01-23 MED ORDER — AMOXICILLIN 500 MG PO CAPS
500.0000 mg | ORAL_CAPSULE | Freq: Once | ORAL | Status: AC
  Administered 2017-01-23: 500 mg via ORAL
  Filled 2017-01-23: qty 1

## 2017-01-23 MED ORDER — OXYCODONE-ACETAMINOPHEN 5-325 MG PO TABS: 1.0000 | ORAL_TABLET | Freq: Four times a day (QID) | ORAL | 0 refills | Status: DC | PRN

## 2017-01-23 MED ORDER — AMOXICILLIN 500 MG PO CAPS: 500.0000 mg | ORAL_CAPSULE | Freq: Three times a day (TID) | ORAL | 0 refills | Status: DC

## 2017-01-23 NOTE — Discharge Instructions (Signed)
Do not drive on percocet.   OPTIONS FOR DENTAL FOLLOW UP CARE  [place] Department of Health and Human Services - Local Safety Net Dental Clinics TripDoors.comhttp://www.ncdhhs.gov/dph/oralhealth/services/safetynetclinics.htm   [facility] [phone])  [facility] [phone])  [facility] [phone])  [facility] [phone])  [facility] [phone]) This clinic caters to the indigent population and is on a lottery system. Location: [employer], [address], [address], [place] [facility] Hours: Wednesdays from 6pm - 9pm, patients seen by a lottery system. For dates, call or go to [email or web address] Services: Cleanings, fillings and simple extractions. Payment Options: DENTAL WORK IS FREE OF CHARGE. Bring proof of income or support. Best way to get seen: Arrive at 5:15 pm - this is a lottery, NOT first come/first serve, so arriving earlier will not increase your chances of being seen.     [facility] [phone] Select option 1 for emergencies   Location: [facility], [place], [address], [place] [facility] Hours: No walk-ins accepted - call the day before to schedule an appointment. Check in times are 9:30 am and 1:30 pm. Services: Simple extractions, temporary fillings, pulpectomy/pulp debridement, uncomplicated abscess drainage. Payment Options: PAYMENT IS DUE AT THE TIME OF SERVICE.  Fee is usually $100-200, additional surgical procedures (e.g. abscess drainage) may be extra. Cash, checks, Visa/MasterCard accepted.  Can file Medicaid if patient is covered for dental - patient should call case worker to check. No discount for [facility] patients. Best way to get seen: MUST call the day before and get onto the schedule. Can usually be seen the next 1-2 days. No walk-ins accepted.     [facility] [phone]   Location: [facility], [address], [place] [facility] Hours: M, W, Th, F 8am or 1:30pm, Tues 9a or 1:30 - first come/first served. Services: Simple extractions, temporary fillings, uncomplicated abscess drainage.  You do not need [place] resident. Payment Options: PAYMENT IS DUE AT THE TIME OF SERVICE. Dental insurance, otherwise sliding scale - bring proof of income or support. Depending on income and treatment needed, cost is usually $50-200. Best way to get seen: Arrive early as it is first come/first served.     [facility] [phone]   Location: [address] Clinic Hours: Mon-Thu 8a-5p Services: Most basic dental services including extractions and fillings. Payment Options: PAYMENT IS DUE AT THE TIME OF SERVICE. Sliding scale, up to 50% off - bring proof if income or support. Medicaid with dental option accepted. Best way to get seen: Call to schedule an appointment, can usually be seen within 2 weeks OR they will try to see walk-ins - show up at 8a or 2p (you may have to wait).     [facility] [phone] [place] RESIDENTS ONLY   Location: [facility], [address], [place], [place] [address] Clinic Hours: By appointment only. Monday - Thursday 8am-5pm, Friday 8am-12pm Services: Cleanings, fillings, extractions. Payment Options: PAYMENT IS DUE AT THE TIME OF SERVICE. Cash, Visa or MasterCard. Sliding scale - $30 minimum per service. Best way to get seen: Come in to office, complete packet and make an appointment - need proof of income or support monies for each household member and proof of [place] residence. Usually takes about a month to get in.     [facility]  [phone]   Location: [place] [facility] Hours: Walk-in Urgent Care Dental Services are  offered Monday-Friday mornings only. The numbers of emergencies accepted daily is limited to the number of providers available. Maximum 15 - Mondays, Wednesdays & Thursdays Maximum 10 - Tuesdays & Fridays Services: You do not need to be a [facility] resident to be seen for a dental emergency. Emergencies are defined as pain, swelling, abnormal bleeding, or dental trauma. Walkins will receive x-rays if needed. NOTE: Dental cleaning is not an emergency. Payment Options: PAYMENT IS DUE AT THE TIME OF SERVICE. Minimum co-pay is $40.00 for uninsured patients. Minimum co-pay is $3.00 for Medicaid with dental coverage. Dental Insurance is accepted and must be presented at time of visit. Medicare does not cover dental. Forms of payment: Cash, credit card, checks. Best way to get seen: If not previously registered with the clinic, walk-in dental registration begins at 7:15 am and is on a first come/first serve basis. If previously registered with the clinic, call to make an appointment.     [facility] [place] ONLY   Location: [address], [place], [place] Clinic Hours: Mon-Thu 10a-2p Services: Extractions only! Payment Options: FREE (donations accepted) - bring proof of income or support Best way to get seen: Call and schedule an appointment OR come at 8am on the 1st Monday of every month (except for holidays) when it is first come/first served.     [facility] [phone]   Location: [place], [place] [facility] Hours: Friday mornings Services, Payment Options, Best way to get seen: Call for info

## 2017-01-23 NOTE — ED Notes (Signed)
Pt ambulatory upon discharge. Verbalized understanding of discharge instructions, prescriptions, pain management and follow-up care. This RN provided information re: [facility], health centers and dental clinics for follow-up care. VSS. A&O x4. Skin warm and dry.

## 2017-01-23 NOTE — ED Triage Notes (Signed)
Pt c/o HA for the past 3 days with nausea. Pt c/o multiple other issues but her main concern today is the HA..Marland Kitchen
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
## 2017-09-25 ENCOUNTER — Other Ambulatory Visit: Payer: Self-pay | Admitting: Registered Nurse

## 2017-09-25 DIAGNOSIS — E049 Nontoxic goiter, unspecified: Secondary | ICD-10-CM

## 2017-10-01 ENCOUNTER — Ambulatory Visit
Admission: RE | Admit: 2017-10-01 | Discharge: 2017-10-01 | Disposition: A | Discharge status: Home / Self Care | Payer: Medicaid Other | Source: Ambulatory Visit | Attending: Registered Nurse | Admitting: Registered Nurse

## 2017-10-01 ENCOUNTER — Ambulatory Visit: Payer: Medicaid Other

## 2017-10-01 DIAGNOSIS — E049 Nontoxic goiter, unspecified: Secondary | ICD-10-CM | POA: Diagnosis present

## 2019-01-04 ENCOUNTER — Other Ambulatory Visit: Payer: Self-pay

## 2019-01-04 ENCOUNTER — Emergency Department: Payer: Medicaid Other

## 2019-01-04 ENCOUNTER — Emergency Department
Admission: EM | Admit: 2019-01-04 | Discharge: 2019-01-04 | Disposition: A | Discharge status: Home / Self Care | Payer: Medicaid Other | Attending: Emergency Medicine | Admitting: Emergency Medicine

## 2019-01-04 DIAGNOSIS — R091 Pleurisy: Secondary | ICD-10-CM | POA: Insufficient documentation

## 2019-01-04 DIAGNOSIS — F17210 Nicotine dependence, cigarettes, uncomplicated: Secondary | ICD-10-CM | POA: Insufficient documentation

## 2019-01-04 DIAGNOSIS — J209 Acute bronchitis, unspecified: Secondary | ICD-10-CM | POA: Insufficient documentation

## 2019-01-04 DIAGNOSIS — R0789 Other chest pain: Secondary | ICD-10-CM | POA: Diagnosis present

## 2019-01-04 HISTORY — DX: Disorder of thyroid, unspecified: E07.9

## 2019-01-04 LAB — POCT PREGNANCY, URINE: Preg Test, Ur: NEGATIVE

## 2019-01-04 LAB — BASIC METABOLIC PANEL
Anion gap: 10 (ref 5–15)
BUN: 11 mg/dL (ref 6–20)
CO2: 22 mmol/L (ref 22–32)
Calcium: 8.8 mg/dL — ABNORMAL LOW (ref 8.9–10.3)
Chloride: 105 mmol/L (ref 98–111)
Creatinine, Ser: 0.72 mg/dL (ref 0.44–1.00)
GFR calc Af Amer: >60 mL/min (ref >60)
GFR calc non Af Amer: >60 mL/min (ref >60)
Glucose, Bld: 98 mg/dL (ref 70–99)
Potassium: 4.1 mmol/L (ref 3.5–5.1)
Sodium: 137 mmol/L (ref 135–145)

## 2019-01-04 LAB — CBC
HCT: 36.7 % (ref 36.0–46.0)
Hemoglobin: 13.1 g/dL (ref 12.0–15.0)
MCH: 32.4 pg (ref 26.0–34.0)
MCHC: 35.7 g/dL (ref 30.0–36.0)
MCV: 90.8 fL (ref 80.0–100.0)
Platelets: 252 10*3/uL (ref 150–400)
RBC: 4.04 MIL/uL (ref 3.87–5.11)
RDW: 11.9 % (ref 11.5–15.5)
WBC: 7.7 10*3/uL (ref 4.0–10.5)
nRBC: 0 % (ref 0.0–0.2)

## 2019-01-04 LAB — TROPONIN I (HIGH SENSITIVITY): Troponin I (High Sensitivity): 3 ng/L (ref <18)

## 2019-01-04 MED ORDER — PREDNISONE 20 MG PO TABS: 40.0000 mg | ORAL_TABLET | Freq: Every day | ORAL | 0 refills | Status: DC

## 2019-01-04 MED ORDER — SODIUM CHLORIDE 0.9 % IV BOLUS
1000.0000 mL | Freq: Once | INTRAVENOUS | Status: AC
  Administered 2019-01-04: 1000 mL via INTRAVENOUS

## 2019-01-04 MED ORDER — AZITHROMYCIN 250 MG PO TABS: ORAL_TABLET | ORAL | 0 refills | Status: DC

## 2019-01-04 MED ORDER — IOHEXOL 350 MG/ML SOLN
75.0000 mL | Freq: Once | INTRAVENOUS | Status: AC | PRN
  Administered 2019-01-04: 09:00:00 75 mL via INTRAVENOUS
  Filled 2019-01-04: qty 75

## 2019-01-04 MED ORDER — ALBUTEROL SULFATE HFA 108 (90 BASE) MCG/ACT IN AERS: 2.0000 | INHALATION_SPRAY | RESPIRATORY_TRACT | 0 refills | Status: DC | PRN

## 2019-01-04 MED ORDER — KETOROLAC TROMETHAMINE 10 MG PO TABS
10.0000 mg | ORAL_TABLET | Freq: Once | ORAL | Status: AC
  Administered 2019-01-04: 10 mg via ORAL
  Filled 2019-01-04: qty 1

## 2019-01-04 NOTE — ED Triage Notes (Signed)
Pt states "I think I'm dying." Pt reports sharp chest pain to the left upper chest and into her underarm. Pt reports something pressing on her throat and making it hard to breathe. Pt has a hx of thyroid disease. Pt has a cough x 2 years but says it is slightly different.

## 2019-01-04 NOTE — ED Provider Notes (Signed)
[facility] Emergency Department Provider Note  ____________________________________________  Time seen: Approximately 10:28 AM  I have reviewed the triage vital signs and the nursing notes.   HISTORY  Chief Complaint Chest Pain    HPI Laurie Hall is a 33 y.o. female with a history of ovarian cyst, thyroid disease who comes the ED complaining of left-sided chest pain described as sharp, radiating to back, hurts to breathe.  Associated shortness of breath and nonproductive cough, ongoing for the past 2 weeks.  Somewhat intermittent.  No alleviating factors.  Moderate intensity.  Current smoker.  Denies any hormonal contraceptive use.  No history of DVT PE, no recent travel trauma hospitalization or surgery.      Past Medical History:  Diagnosis Date  . Abnormal Pap smear    x4, repeats ok  . Heart palpitations   . Meningitis   . Ovarian cyst   . Thyroid disease   . Urinary tract infection      Patient Active Problem List   Diagnosis Date Noted  . Vaginal bleeding 01/02/2013  . Threatened preterm labor, antepartum 08/13/2012  . Suspected fetal anomaly, antepartum-Ventriculomegaly in Twin B 05/23/2012  . Rubella non-immune status, antepartum 05/09/2012  . Subclinical hyperthyroidism 05/08/2012  . Twin pregnancy, antepartum 05/02/2012  . Previous cesarean delivery, antepartum condition or complication 05/02/2012  . Pregnancy with history of pre-term labor 05/02/2012  . Abnormal weight loss 05/02/2012  . TOBACCO DEPENDENCE 04/26/2006     Past Surgical History:  Procedure Laterality Date  . CESAREAN SECTION    . CESAREAN SECTION N/A 09/01/2012   Procedure: Repeat cesarean section with delivery of Baby A girl at 1559. Baby B boy at 1600.;  Surgeon: Lesly DukesKelly H Leggett, MD;  Location: [facility];  Service: Obstetrics;  Laterality: N/A;  . THERAPEUTIC ABORTION       Prior to Admission medications   Medication Sig Start Date End Date Taking?  Authorizing Provider  albuterol (PROVENTIL HFA) 108 (90 Base) MCG/ACT inhaler Inhale 2 puffs into the lungs every 4 (four) hours as needed for wheezing or shortness of breath. 01/04/19   Sharman CheekStafford, Elimelech Houseman, MD  amoxicillin (AMOXIL) 500 MG capsule Take 1 capsule (500 mg total) by mouth 3 (three) times daily. 01/23/17   Jeanmarie PlantMcShane, James A, MD  azithromycin (ZITHROMAX Z-PAK) 250 MG tablet Take 2 tablets (500 mg) on  Day 1,  followed by 1 tablet (250 mg) once daily on Days 2 through 5. 01/04/19   Sharman CheekStafford, Trinetta Alemu, MD  diphenhydrAMINE (BENADRYL) 25 MG tablet Take 25 mg by mouth every 6 (six) hours as needed for itching or allergies.    [provider]  ibuprofen (ADVIL,MOTRIN) 800 MG tablet Take 1 tablet (800 mg total) by mouth every 8 (eight) hours as needed. Patient not taking: Reported on 01/23/2017 05/31/15   Joni ReiningSmith, Ronald K, PA-C  methocarbamol (ROBAXIN-750) 750 MG tablet Take 2 tablets (1,500 mg total) by mouth 4 (four) times daily. Patient not taking: Reported on 01/23/2017 05/31/15   Joni ReiningSmith, Ronald K, PA-C  oxyCODONE-acetaminophen (ROXICET) 5-325 MG tablet Take 1 tablet by mouth every 6 (six) hours as needed. 01/23/17   Jeanmarie PlantMcShane, James A, MD  predniSONE (DELTASONE) 20 MG tablet Take 2 tablets (40 mg total) by mouth daily. 01/04/19   Sharman CheekStafford, Jejuan Scala, MD     Allergies Acetaminophen   Family History  Problem Relation Age of Onset  . Hyperthyroidism Mother   . Heart disease Mother   . Diabetes Mother   . Hyperthyroidism Maternal  Grandfather   . Heart disease Maternal Grandfather   . Hyperthyroidism Maternal Aunt   . Hypothyroidism Maternal Grandmother   . Heart murmur Maternal Grandmother   . Diabetes Maternal Grandmother   . Other Neg Hx     Social History Social History   Tobacco Use  . Smoking status: Current Every Day Smoker    Packs/day: 1.00    Years: 8.00    Pack years: 8.00    Types: Cigarettes  . Smokeless tobacco: Never Used  Substance Use Topics  . Alcohol use: No     Comment: social  . Drug use: No    Review of Systems  Constitutional:   No fever or chills.  ENT:   No sore throat. No rhinorrhea. Cardiovascular: Positive chest pain as above without syncope. Respiratory:   Positive shortness of breath with nonproductive cough. Gastrointestinal:   Negative for abdominal pain, vomiting and diarrhea.  Musculoskeletal:   Negative for focal pain or swelling All other systems reviewed and are negative except as documented above in ROS and HPI.  ____________________________________________   PHYSICAL EXAM:  VITAL SIGNS: ED Triage Vitals  Enc Vitals Group     BP 01/04/19 0600 135/85     Pulse Rate 01/04/19 0600 91     Resp 01/04/19 0600 (!) 26     Temp 01/04/19 0600 98.4 F (36.9 C)     Temp Source 01/04/19 0600 Oral     SpO2 01/04/19 0600 96 %     Weight 01/04/19 0600 214 lb (97.1 kg)     Height 01/04/19 0600 5\' 10"  (1.778 m)     Head Circumference --      Peak Flow --      Pain Score 01/04/19 0604 9     Pain Loc --      Pain Edu? --      Excl. in GC? --     Vital signs reviewed, nursing assessments reviewed.   Constitutional:   Alert and oriented. Non-toxic appearance. Eyes:   Conjunctivae are normal. EOMI. PERRL. ENT      Head:   Normocephalic and atraumatic.      Nose:   Wearing a mask.      Mouth/Throat:   Wearing a mask.      Neck:   No meningismus. Full ROM. Hematological/Lymphatic/Immunilogical:   No cervical lymphadenopathy. Cardiovascular:   RRR. Symmetric bilateral radial and DP pulses.  No murmurs. Cap refill less than 2 seconds. Respiratory:   Normal respiratory effort without tachypnea/retractions. Breath sounds are clear and equal bilaterally. No wheezes/rales/rhonchi.  No inducible coughing or wheezing with FEV1 maneuver. Gastrointestinal:   Soft and nontender. Non distended. There is no CVA tenderness.  No rebound, rigidity, or guarding.  Musculoskeletal:   Normal range of motion in all extremities. No joint  effusions.  No lower extremity tenderness.  No edema.  Chest wall nontender to the touch as demonstrated by the patient Neurologic:   Normal speech and language.  Motor grossly intact. No acute focal neurologic deficits are appreciated.  Skin:    Skin is warm, dry and intact. No rash noted.  No petechiae, purpura, or bullae.  ____________________________________________    LABS (pertinent positives/negatives) (all labs ordered are listed, but only abnormal results are displayed) Labs Reviewed  BASIC METABOLIC PANEL - Abnormal; Notable for the following components:      Result Value   Calcium 8.8 (*)    All other components within normal limits  CBC  POC URINE PREG,  ED  POCT PREGNANCY, URINE  TROPONIN I (HIGH SENSITIVITY)  TROPONIN I (HIGH SENSITIVITY)   ____________________________________________   EKG  Interpreted by me Normal sinus rhythm rate of 85, normal axis intervals QRS ST segments and T waves  ____________________________________________    RADIOLOGY  Dg Chest 2 View  Result Date: 01/04/2019 CLINICAL DATA:  Chest pain 2 weeks. EXAM: CHEST - 2 VIEW COMPARISON:  10/23/2008 FINDINGS: The heart size and mediastinal contours are within normal limits. Both lungs are clear. The visualized skeletal structures are unremarkable. IMPRESSION: No active cardiopulmonary disease. Electronically Signed   By: Elberta Fortis M.D.   On: 01/04/2019 07:55   Ct Angio Chest Pe W And/or Wo Contrast  Result Date: 01/04/2019 CLINICAL DATA:  Chest pain, cough x2 months. Previous tobacco abuse. EXAM: CT ANGIOGRAPHY CHEST WITH CONTRAST TECHNIQUE: Multidetector CT imaging of the chest was performed using the standard protocol during bolus administration of intravenous contrast. Multiplanar CT image reconstructions and MIPs were obtained to evaluate the vascular anatomy. CONTRAST:  75mL OMNIPAQUE IOHEXOL 350 MG/ML SOLN COMPARISON:  None. FINDINGS: Cardiovascular: Heart size normal. No pericardial  effusion. The RV is nondilated. Satisfactory opacification of pulmonary arteries noted, and there is no evidence of pulmonary emboli. Adequate contrast opacification of the thoracic aorta with no evidence of dissection, aneurysm, or stenosis. There is classic 3-vessel brachiocephalic arch anatomy without proximal stenosis. Visualized abdominal aorta unremarkable. Mediastinum/Nodes: No hilar or mediastinal adenopathy. Enlarged heterogenous thyroid right greater than left, with mild leftward tracheal deviation and narrowing. Lungs/Pleura: No pneumothorax. No pleural effusion. 5 mm nodule with angular morphology adjacent to the right major fissure images 15/9 and 18/8 probable intrapulmonary lymph node, no follow-up recommended. Lungs are otherwise clear. Upper Abdomen: No acute findings. Musculoskeletal: Small Schmorl's nodes in the midthoracic spine. No acute fracture or worrisome bone lesion. Review of the MIP images confirms the above findings. IMPRESSION: 1. Negative for acute PE or thoracic aortic dissection. 2. Enlarged heterogenous thyroid. Recommend elective outpatient ultrasound for further characterization. Electronically Signed   By: Corlis Leak M.D.   On: 01/04/2019 09:36    ____________________________________________   PROCEDURES Procedures  ____________________________________________  DIFFERENTIAL DIAGNOSIS   Bronchitis/pleurisy, pneumothorax, pulmonary embolism  CLINICAL IMPRESSION / ASSESSMENT AND PLAN / ED COURSE  Medications ordered in the ED: Medications  sodium chloride 0.9 % bolus 1,000 mL (1,000 mLs Intravenous New Bag/Given 01/04/19 0825)  iohexol (OMNIPAQUE) 350 MG/ML injection 75 mL (75 mLs Intravenous Contrast Given 01/04/19 0850)    Pertinent labs & imaging results that were available during my care of the patient were reviewed by me and considered in my medical decision making (see chart for details).  Laurie Hall was evaluated in Emergency Department on  01/04/2019 for the symptoms described in the history of present illness. She was evaluated in the context of the global COVID-19 pandemic, which necessitated consideration that the patient might be at risk for infection with the SARS-CoV-2 virus that causes COVID-19. Institutional protocols and algorithms that pertain to the evaluation of patients at risk for COVID-19 are in a state of rapid change based on information released by regulatory bodies including the CDC and federal and state organizations. These policies and algorithms were followed during the patient's care in the ED.   Patient presents with pleuritic chest pain for 2 weeks.  EKG and chest x-ray are unremarkable, initial labs are unremarkable as well.  Doubt ACS dissection AAA pneumothorax pericarditis pneumonia pulmonary edema or pleural effusion.  Due to possibility of a  small pneumothorax versus pulmonary embolism as the most likely diagnosis outside of bronchitis, CT angiogram obtained which is negative for PE and reassuring.  I will treat with azithromycin, prednisone, albuterol, primary care follow-up.      ____________________________________________   FINAL CLINICAL IMPRESSION(S) / ED DIAGNOSES    Final diagnoses:  Pleurisy  Acute bronchitis, unspecified organism     ED Discharge Orders         Ordered    azithromycin (ZITHROMAX Z-PAK) 250 MG tablet     01/04/19 1027    albuterol (PROVENTIL HFA) 108 (90 Base) MCG/ACT inhaler  Every 4 hours PRN     01/04/19 1027    predniSONE (DELTASONE) 20 MG tablet  Daily     01/04/19 1027          Portions of this note were generated with dragon dictation software. Dictation errors may occur despite best attempts at proofreading.   Carrie Mew, MD 01/04/19 1031

## 2019-01-04 NOTE — ED Notes (Signed)
Patient returned from CT

## 2019-01-04 NOTE — Discharge Instructions (Addendum)
Your chest x-ray and CT scan of the chest were both okay today.  Your labs are also normal.  This appears to be due to lung inflammation from an atypical pneumonia, which can be treated with azithromycin, prednisone, and albuterol.  Follow-up with primary care in about a week.

## 2019-02-24 ENCOUNTER — Institutional Professional Consult (permissible substitution): Payer: Medicaid Other | Admitting: Pulmonary Disease

## 2019-03-11 ENCOUNTER — Ambulatory Visit (INDEPENDENT_AMBULATORY_CARE_PROVIDER_SITE_OTHER): Payer: Medicaid Other | Admitting: Pulmonary Disease

## 2019-03-11 ENCOUNTER — Other Ambulatory Visit: Payer: Self-pay

## 2019-03-11 ENCOUNTER — Encounter: Payer: Self-pay | Admitting: Pulmonary Disease

## 2019-03-11 VITALS — BP 136/74 | HR 94 | Temp 97.2°F | Ht 69.0 in | Wt 214.0 lb

## 2019-03-11 DIAGNOSIS — J398 Other specified diseases of upper respiratory tract: Secondary | ICD-10-CM | POA: Diagnosis not present

## 2019-03-11 DIAGNOSIS — E049 Nontoxic goiter, unspecified: Secondary | ICD-10-CM | POA: Diagnosis not present

## 2019-03-11 DIAGNOSIS — F17210 Nicotine dependence, cigarettes, uncomplicated: Secondary | ICD-10-CM | POA: Diagnosis not present

## 2019-03-11 NOTE — Progress Notes (Signed)
Subjective:    Patient ID: Laurie Hall, female    DOB: [birth year], 34 y.o.   MRN: [id number]  HPI Ms. Ante is a 34 year old current smoker (1 PPD, currently cutting down) who presents for evaluation of cough and dyspnea.  She is kindly referred by the [facility] in [place].  She states that she has had a nonproductive cough since she has been diagnosed with thyroid issues.  However over the last 3 to 4 months she has been noticing increasing cough and dyspnea.  She has noticed that the cough makes it difficult to sleep and or eat.  Sometimes fresh tears may help it but this is not consistent.  No medications make it better.  She states also that any type of physical activity aggravates the issue.  She has never been diagnosed with any pulmonary illness in the past.  No known history of asthma.  She does not note any wheezing, fevers, chills or sweats.  She has noted some orthopnea also right fairly recent over the last 3 to 4 months and occasionally she will be awakened by shortness of breath (paroxysmal nocturnal dyspnea).  She notes throat discomfort and dysphagia.  She has had issues with gastroesophageal reflux symptoms.  She notes also chest discomfort and occasional palpitations.  Past medical history, surgical history and family history have been reviewed.  She has a strong family history of thyroid disease in the family with several family members requiring thyroidectomy due to goiters.  She has been noted to have a goiter herself.  Patient has no significant occupational history.  She has been smoking for 8 years.  As noted she has been smoking 1 pack of cigarettes per day but over the last several days has tried to cut down to 1 to 2 cigarettes/day.   Review of Systems A 10 point review of systems was performed and it is as noted above otherwise negative.    Objective:   Physical Exam Vitals and nursing note reviewed.  Constitutional:      General: She is  awake.     Appearance: She is well-developed and overweight. She is not ill-appearing.  HENT:     Head: Normocephalic and atraumatic.     Right Ear: External ear normal.     Left Ear: External ear normal.     Mouth/Throat:     Comments: Nose/mouth/throat not examined due to masking requirements for COVID 19. Eyes:     Conjunctiva/sclera: Conjunctivae normal.  Neck:     Thyroid: Thyromegaly (Large thyroid goiter right greater than left) present.     Trachea: Phonation normal. No tracheal deviation.  Cardiovascular:     Rate and Rhythm: Normal rate and regular rhythm.     Pulses: Normal pulses.     Heart sounds: Normal heart sounds.  Pulmonary:     Effort: Pulmonary effort is normal.     Breath sounds: Normal breath sounds. No stridor (No overt stridor but coarse upper airway sounds).  Abdominal:     General: There is no distension.  Musculoskeletal:        General: Normal range of motion.     Cervical back: Neck supple.     Right lower leg: No edema.     Left lower leg: No edema.  Skin:    General: Skin is warm and dry.  Neurological:     General: No focal deficit present.     Mental Status: She is alert and oriented to person, place, and  time.  Psychiatric:        Mood and Affect: Mood normal.        Behavior: Behavior normal. Behavior is cooperative.    Reviewed patient's CT angio from 01/04/2019 and chest x-ray performed that day, the upper airway is narrowed by the very large thyroid and trachea is deviated to the left by the thyroid.  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   Gailen Shelter, MD [facility]   *This note was dictated using voice recognition software/Dragon.  Despite best efforts to proofread, errors can occur which can change the meaning.  Any change was purely unintentional.

## 2019-03-11 NOTE — Patient Instructions (Signed)
1.  After our evaluation today I recommend that you have your thyroid removed as it is causing narrowing of your airway.  2.  I will reach out to [facility] so they can make this referral for you.  3.  We will see you in follow-up in 3 months time call sooner should any new problems arise.

## 2019-03-19 ENCOUNTER — Encounter: Payer: Self-pay | Admitting: Pulmonary Disease

## 2019-04-28 DIAGNOSIS — Z8709 Personal history of other diseases of the respiratory system: Secondary | ICD-10-CM

## 2019-04-28 DIAGNOSIS — R1310 Dysphagia, unspecified: Secondary | ICD-10-CM

## 2019-04-28 DIAGNOSIS — Z87898 Personal history of other specified conditions: Secondary | ICD-10-CM

## 2019-04-28 HISTORY — DX: Personal history of other diseases of the respiratory system: Z87.09

## 2019-04-28 HISTORY — DX: Dysphagia, unspecified: R13.10

## 2019-04-28 HISTORY — DX: Personal history of other specified conditions: Z87.898

## 2019-06-26 ENCOUNTER — Other Ambulatory Visit: Payer: Self-pay

## 2019-06-26 ENCOUNTER — Encounter: Payer: Self-pay | Admitting: General Surgery

## 2019-06-26 ENCOUNTER — Ambulatory Visit: Payer: Medicaid Other | Admitting: General Surgery

## 2019-06-26 VITALS — BP 123/86 | HR 83 | Temp 97.7°F | Resp 12 | Ht 70.0 in | Wt 215.0 lb

## 2019-06-26 DIAGNOSIS — E042 Nontoxic multinodular goiter: Secondary | ICD-10-CM

## 2019-06-26 NOTE — Patient Instructions (Addendum)
Our surgery scheduler will contact you to schedule your surgery. Please have the Blue Sheet available when she calls you.   Please call the office if you have any questions or concerns.   Thyroidectomy A thyroidectomy is a surgery that is done to remove the thyroid gland. The thyroid is a butterfly-shaped gland that is located at the lower front of your neck. It produces thyroid hormone, which is a substance that helps to control certain body processes. You may have a:  Total thyroidectomy. All of your thyroid is removed.  Thyroid lobectomy. Part of your thyroid is removed. The amount of thyroid gland tissue that is removed during your surgery depends on the reason for the procedure. Reasons to have this procedure include treatment for:  Thyroid nodules.  Thyroid cancer.  Benign thyroid tumors.  Goiter.  Overactive thyroid gland (hyperthyroidism). There are two ways to do this procedure. Conventional, or open, thyroidectomy uses one large incision to remove the thyroid gland. This is the most common method. Endoscopic thyroidectomy, a less invasive method, uses a narrow tube with a light and camera (endoscope) to remove the gland. Tell a health care provider about:  Any allergies you have.  All medicines you are taking, including vitamins, herbs, eye drops, creams, and over-the-counter medicines.  Any problems you or family members have had with anesthetic medicines.  Any blood disorders you have.  Any surgeries you have had.  Any medical conditions you have.  Whether you are pregnant or may be pregnant. What are the risks? Generally, this is a safe procedure. However, problems may occur, including:  Damage to the parathyroid glands. These are located behind your thyroid gland. They maintain the calcium levels in the body. Damage may lead to: ? A decrease in parathyroid hormone levels (hypoparathyroidism). ? A decrease in calcium levels. This will make your nerves irritable  and may cause muscle spasms.  An increase in thyroid hormone.  Damage to the nerves of your voice box (larynx). This can be temporary or long-term (rare).  Hoarseness. This usually resolves in 24-48 hours.  Bleeding.  Infection. What happens before the procedure? Staying hydrated Follow instructions from your health care provider about hydration, which may include:  Up to 2 hours before the procedure - you may continue to drink clear liquids, such as water, clear fruit juice, black coffee, and plain tea. Eating and drinking restrictions Follow instructions from your health care provider about eating and drinking, which may include:  8 hours before the procedure - stop eating heavy meals or foods such as meat, fried foods, or fatty foods.  6 hours before the procedure - stop eating light meals or foods, such as toast or cereal.  6 hours before the procedure - stop drinking milk or drinks that contain milk.  2 hours before the procedure - stop drinking clear liquids. Medicines Ask your health care provider about:  Changing or stopping your regular medicines. This is especially important if you are taking diabetes medicines or blood thinners.  Taking medicines such as aspirin and ibuprofen. These medicines can thin your blood. Do not take these medicines unless your health care provider tells you to take them.  Taking over-the-counter medicines, vitamins, herbs, and supplements. General instructions  You may be asked to shower with a germ-killing soap.  Plan to have someone take you home from the hospital or clinic.  Plan to have a responsible adult care for you for at least 24 hours after you leave the hospital or clinic.   This is important. What happens during the procedure?  To reduce your risk of infection: ? Your health care team will wash or sanitize their hands. ? Hair may be removed from the surgical area. ? Your skin will be washed with soap.  An IV will be  inserted into one of your veins.  You will be given one or more of the following: ? A medicine to help you relax (sedative). ? A medicine to make you fall asleep (general anesthetic).  Your health care provider will perform your surgery using one of two methods: ? For open thyroidectomy, an incision will be made in your lower neck. Muscles in the area will be separated to reveal your thyroid gland. ? For endoscopic thyroidectomy, several small incisions will be made in your neck, chest, or armpit. An endoscopewill be inserted into an incision.  Your health care provider may monitor laryngeal nerve function during the procedure for safety reasons.  Part or all of your thyroid gland will be removed.  A tube (drain) may be placed at the incision site to drain blood and fluids that accumulate under the skin after the procedure. The drain may have to stay in place for a day or two after the procedure.  The incision will be closed with stitches (sutures).  A dressing will be placed over your incision. The procedure may vary among health care providers and hospitals. What happens after the procedure?  Your blood pressure, heart rate, breathing rate, and blood oxygen level will be monitored often until the medicines you were given have worn off.  You will be given pain medicine as needed.  Your provider will check your ability to talk and swallow after the procedure.  You will gradually start to drink liquids and have soft foods as tolerated.  You may have a blood test to check the level of calcium in your body.  If you had a drain put in during the procedure, it will usually be removed the next day. Summary  A thyroidectomy is a surgery that is done to remove the thyroid gland.  The procedure will be done in one of two ways: conventional, or open, thyroidectomy or endoscopic thyroidectomy.  Serious complications are rare.  Plan to have a responsible adult care for you for at least  24 hours after you leave the hospital or clinic. This is important. This information is not intended to replace advice given to you by your health care provider. Make sure you discuss any questions you have with your health care provider. Document Revised: 01/26/2017 Document Reviewed: 12/19/2016 Elsevier Patient Education  2020 [employer]vier Inc.  

## 2019-06-26 NOTE — Progress Notes (Signed)
Patient ID: Laurie Hall, female   DOB: 1986-01-10, 34 y.o.   MRN: [id number]  Chief Complaint  Patient presents with  . New Patient (Initial Visit)    multinodular goiter    HPI Laurie Hall is a 34 y.o. female.   She has been referred by her primary care provider for surgical evaluation of a multinodular goiter.  She has had a known goiter for many years.  According to the primary care provider notes, she was diagnosed with hypothyroidism in 2005.  She was briefly on thyroid hormone replacement, but did not continue this secondary to her complaints of fatigue weight gain and night sweats.  A series of thyroid hormone function testing performed in July 2019 showed that her TSH was at the upper end of normal.  Thyroid peroxidase antibodies were also normal.  An ultrasound of the thyroid was performed that showed multiple thyroid nodules.  Several of these met criteria for biopsy.  This was performed in September 2019.  The results were  benign.  She has had progressive compressive symptoms, including dysphagia to solids, frequent throat clearing, pressure in her neck while in a supine position.  He presented to the emergency department in November 2020 with complaints of pleuritic chest pain.  A CT angiogram was performed that happens to demonstrate a large multinodular goiter with some mild leftward tracheal deviation secondary to right greater than left enlargement.  She was subsequently seen by pulmonology and was advised to consider thyroidectomy.  She continues to endorse difficulty swallowing and frequent throat clearing.  She finds it difficult to sleep secondary to the pressure in her neck.  She denies any heart palpitations or hand tremors.  She does endorse thinner hair than usual as well as dry her skin.  No constipation or diarrhea.  She reports fatigue and weight gain.  No ocular symptoms.  No history of occupational or therapeutic exposure to ionizing radiation.  She does report  that her mother, maternal grandmother, maternal grandfather, and a cousin have all had goiters.  There is no family history of thyroid cancer.  She is interested in thyroidectomy.  She is currently taking no thyroid hormone replacement.   Past Medical History:  Diagnosis Date  . Abnormal Pap smear    x4, repeats ok  . Allergy   . GERD (gastroesophageal reflux disease)   . Heart palpitations   . Meningitis   . Ovarian cyst   . Thyroid disease   . Urinary tract infection     Past Surgical History:  Procedure Laterality Date  . CESAREAN SECTION    . CESAREAN SECTION N/A 09/01/2012   Procedure: Repeat cesarean section with delivery of Baby A girl at 1559. Baby B boy at 1600.;  Surgeon: Guss Bunde, MD;  Location: [facility];  Service: Obstetrics;  Laterality: N/A;  . THERAPEUTIC ABORTION    . WISDOM TOOTH EXTRACTION      Family History  Problem Relation Age of Onset  . Hyperthyroidism Mother   . Heart disease Mother   . Diabetes Mother   . Hyperthyroidism Maternal Grandfather   . Heart disease Maternal Grandfather   . Hyperthyroidism Maternal Aunt   . Hypothyroidism Maternal Grandmother   . Heart murmur Maternal Grandmother   . Diabetes Maternal Grandmother   . Other Neg Hx     Social History Social History   Tobacco Use  . Smoking status: Current Every Day Smoker    Packs/day: 0.50    Years: 8.00  Pack years: 4.00    Types: Cigarettes  . Smokeless tobacco: Never Used  . Tobacco comment: 1-2 cigarettes daily- 03/11/2019  Substance Use Topics  . Alcohol use: No    Comment: social  . Drug use: No    Allergies  Allergen Reactions  . Acetaminophen   . Aspirin Rash    Current Outpatient Medications  Medication Sig Dispense Refill  . diphenhydrAMINE (BENADRYL) 25 MG tablet Take 25 mg by mouth every 6 (six) hours as needed for itching or allergies.    Marland Kitchen ibuprofen (ADVIL,MOTRIN) 800 MG tablet Take 1 tablet (800 mg total) by mouth every 8 (eight) hours as needed. 30  tablet 0  . pantoprazole (PROTONIX) 40 MG tablet Take 1 tablet by mouth daily.     Current Facility-Administered Medications  Medication Dose Route Frequency Provider Last Rate Last Admin  . medroxyPROGESTERone (DEPO-PROVERA) injection 150 mg  150 mg Intramuscular Q90 days Anyanwu, Ugonna A, MD   150 mg at 09/12/12 1440    Review of Systems Review of Systems  All other systems reviewed and are negative. Or as discussed in the history of present illness.  Blood pressure 123/86, pulse 83, temperature 97.7 F (36.5 C), resp. rate 12, height _0  (1.778 m), weight 215 lb (97.5 kg), SpO2 97 %. Body mass index is 30.85 kg/m.  Physical Exam Physical Exam Constitutional:      General: She is not in acute distress.    Appearance: Normal appearance.  HENT:     Head: Normocephalic and atraumatic.     Nose:     Comments: Covered with a mask secondary to COVID-19 precautions    Mouth/Throat:     Comments: Covered with a mask secondary to COVID-19 precautions Eyes:     General: No scleral icterus.       Right eye: No discharge.        Left eye: No discharge.     Comments: No proptosis or exophthalmos.  No lid lag or stare.  No periorbital edema.  Neck:     Comments: Her thyroid is visible from across the room.  There is mild leftward tracheal deviation.  The thyroid is above her clavicles with deglutition.  No dominant masses or nodules palpated.  No cervical or supraclavicular lymphadenopathy. Cardiovascular:     Rate and Rhythm: Normal rate and regular rhythm.     Pulses: Normal pulses.  Pulmonary:     Effort: Pulmonary effort is normal.     Breath sounds: Normal breath sounds.  Abdominal:     General: Bowel sounds are normal.     Palpations: Abdomen is soft.  Genitourinary:    Comments: Deferred Musculoskeletal:        General: No swelling or tenderness.     Comments: No pretibial edema or dermatopathy  Skin:    General: Skin is warm and dry.  Neurological:     General: No  focal deficit present.     Mental Status: She is alert and oriented to person, place, and time.  Psychiatric:        Mood and Affect: Mood normal.        Behavior: Behavior normal.     Data Reviewed I reviewed the outside provider notes that were sent over.  These describe the history as discussed in the history of present illness.  She had a more recent TSH in March 2021 that was within normal limits at 0.734.  I was also able to review the endocrinology notes within  the electronic medical record.  This is also where I was able to find the negative biopsy results.  I also reviewed the imaging done at the time of her presentation to the emergency department.  This does describe an enlarged heterogeneous thyroid.  On my personal review of the imaging, I am able to appreciate the deviation of the trachea.  There is only minimal narrowing present, and the gland does not extend down into the superior mediastinum.  I also reviewed Dr. Domingo Dimes note of March 11, 2019, wherein she recommends thyroidectomy.  Assessment This is a 34 year old woman with a longstanding multinodular goiter.  She has developed increasing compressive symptoms and would like to pursue thyroidectomy for relief of said symptoms.  Plan I have offered her a total thyroidectomy.The risks of thyroid surgery were discussed, including (but not limited to): bleeding, infection, damage to surrounding structures/tissues, injury (temporary or permanent) to the recurrent laryngeal nerve, hypoparathyroidism (temporary or permanent), need for thyroid hormone replacement therapy, need for additional surgery and/or treatment, recurrence of disease, tracheostomy (temporary or permanent).  The patient had the opportunity to ask any questions and these were answered to their satisfaction.  I also discussed the use of intraoperative recurrent laryngeal nerve monitoring, as well as its limitations.  We will work on getting her scheduled at the  soonest mutually convenient date.    Fredirick Maudlin 06/26/2019, 1:22 PM

## 2019-06-26 NOTE — H&P (View-Only) (Signed)
Patient ID: Laurie Hall, female   DOB: 1986-01-10, 34 y.o.   MRN: [id number]  Chief Complaint  Patient presents with  . New Patient (Initial Visit)    multinodular goiter    HPI Laurie Hall is a 34 y.o. female.   She has been referred by her primary care provider for surgical evaluation of a multinodular goiter.  She has had a known goiter for many years.  According to the primary care provider notes, she was diagnosed with hypothyroidism in 2005.  She was briefly on thyroid hormone replacement, but did not continue this secondary to her complaints of fatigue weight gain and night sweats.  A series of thyroid hormone function testing performed in July 2019 showed that her TSH was at the upper end of normal.  Thyroid peroxidase antibodies were also normal.  An ultrasound of the thyroid was performed that showed multiple thyroid nodules.  Several of these met criteria for biopsy.  This was performed in September 2019.  The results were  benign.  She has had progressive compressive symptoms, including dysphagia to solids, frequent throat clearing, pressure in her neck while in a supine position.  He presented to the emergency department in November 2020 with complaints of pleuritic chest pain.  A CT angiogram was performed that happens to demonstrate a large multinodular goiter with some mild leftward tracheal deviation secondary to right greater than left enlargement.  She was subsequently seen by pulmonology and was advised to consider thyroidectomy.  She continues to endorse difficulty swallowing and frequent throat clearing.  She finds it difficult to sleep secondary to the pressure in her neck.  She denies any heart palpitations or hand tremors.  She does endorse thinner hair than usual as well as dry her skin.  No constipation or diarrhea.  She reports fatigue and weight gain.  No ocular symptoms.  No history of occupational or therapeutic exposure to ionizing radiation.  She does report  that her mother, maternal grandmother, maternal grandfather, and a cousin have all had goiters.  There is no family history of thyroid cancer.  She is interested in thyroidectomy.  She is currently taking no thyroid hormone replacement.   Past Medical History:  Diagnosis Date  . Abnormal Pap smear    x4, repeats ok  . Allergy   . GERD (gastroesophageal reflux disease)   . Heart palpitations   . Meningitis   . Ovarian cyst   . Thyroid disease   . Urinary tract infection     Past Surgical History:  Procedure Laterality Date  . CESAREAN SECTION    . CESAREAN SECTION N/A 09/01/2012   Procedure: Repeat cesarean section with delivery of Baby A girl at 1559. Baby B boy at 1600.;  Surgeon: Guss Bunde, MD;  Location: [facility];  Service: Obstetrics;  Laterality: N/A;  . THERAPEUTIC ABORTION    . WISDOM TOOTH EXTRACTION      Family History  Problem Relation Age of Onset  . Hyperthyroidism Mother   . Heart disease Mother   . Diabetes Mother   . Hyperthyroidism Maternal Grandfather   . Heart disease Maternal Grandfather   . Hyperthyroidism Maternal Aunt   . Hypothyroidism Maternal Grandmother   . Heart murmur Maternal Grandmother   . Diabetes Maternal Grandmother   . Other Neg Hx     Social History Social History   Tobacco Use  . Smoking status: Current Every Day Smoker    Packs/day: 0.50    Years: 8.00  Pack years: 4.00    Types: Cigarettes  . Smokeless tobacco: Never Used  . Tobacco comment: 1-2 cigarettes daily- 03/11/2019  Substance Use Topics  . Alcohol use: No    Comment: social  . Drug use: No    Allergies  Allergen Reactions  . Acetaminophen   . Aspirin Rash    Current Outpatient Medications  Medication Sig Dispense Refill  . diphenhydrAMINE (BENADRYL) 25 MG tablet Take 25 mg by mouth every 6 (six) hours as needed for itching or allergies.    Marland Kitchen ibuprofen (ADVIL,MOTRIN) 800 MG tablet Take 1 tablet (800 mg total) by mouth every 8 (eight) hours as needed. 30  tablet 0  . pantoprazole (PROTONIX) 40 MG tablet Take 1 tablet by mouth daily.     Current Facility-Administered Medications  Medication Dose Route Frequency Provider Last Rate Last Admin  . medroxyPROGESTERone (DEPO-PROVERA) injection 150 mg  150 mg Intramuscular Q90 days Anyanwu, Ugonna A, MD   150 mg at 09/12/12 1440    Review of Systems Review of Systems  All other systems reviewed and are negative. Or as discussed in the history of present illness.  Blood pressure 123/86, pulse 83, temperature 97.7 F (36.5 C), resp. rate 12, height _0  (1.778 m), weight 215 lb (97.5 kg), SpO2 97 %. Body mass index is 30.85 kg/m.  Physical Exam Physical Exam Constitutional:      General: She is not in acute distress.    Appearance: Normal appearance.  HENT:     Head: Normocephalic and atraumatic.     Nose:     Comments: Covered with a mask secondary to COVID-19 precautions    Mouth/Throat:     Comments: Covered with a mask secondary to COVID-19 precautions Eyes:     General: No scleral icterus.       Right eye: No discharge.        Left eye: No discharge.     Comments: No proptosis or exophthalmos.  No lid lag or stare.  No periorbital edema.  Neck:     Comments: Her thyroid is visible from across the room.  There is mild leftward tracheal deviation.  The thyroid is above her clavicles with deglutition.  No dominant masses or nodules palpated.  No cervical or supraclavicular lymphadenopathy. Cardiovascular:     Rate and Rhythm: Normal rate and regular rhythm.     Pulses: Normal pulses.  Pulmonary:     Effort: Pulmonary effort is normal.     Breath sounds: Normal breath sounds.  Abdominal:     General: Bowel sounds are normal.     Palpations: Abdomen is soft.  Genitourinary:    Comments: Deferred Musculoskeletal:        General: No swelling or tenderness.     Comments: No pretibial edema or dermatopathy  Skin:    General: Skin is warm and dry.  Neurological:     General: No  focal deficit present.     Mental Status: She is alert and oriented to person, place, and time.  Psychiatric:        Mood and Affect: Mood normal.        Behavior: Behavior normal.     Data Reviewed I reviewed the outside provider notes that were sent over.  These describe the history as discussed in the history of present illness.  She had a more recent TSH in March 2021 that was within normal limits at 0.734.  I was also able to review the endocrinology notes within  the electronic medical record.  This is also where I was able to find the negative biopsy results.  I also reviewed the imaging done at the time of her presentation to the emergency department.  This does describe an enlarged heterogeneous thyroid.  On my personal review of the imaging, I am able to appreciate the deviation of the trachea.  There is only minimal narrowing present, and the gland does not extend down into the superior mediastinum.  I also reviewed Dr. Domingo Dimes note of March 11, 2019, wherein she recommends thyroidectomy.  Assessment This is a 34 year old woman with a longstanding multinodular goiter.  She has developed increasing compressive symptoms and would like to pursue thyroidectomy for relief of said symptoms.  Plan I have offered her a total thyroidectomy.The risks of thyroid surgery were discussed, including (but not limited to): bleeding, infection, damage to surrounding structures/tissues, injury (temporary or permanent) to the recurrent laryngeal nerve, hypoparathyroidism (temporary or permanent), need for thyroid hormone replacement therapy, need for additional surgery and/or treatment, recurrence of disease, tracheostomy (temporary or permanent).  The patient had the opportunity to ask any questions and these were answered to their satisfaction.  I also discussed the use of intraoperative recurrent laryngeal nerve monitoring, as well as its limitations.  We will work on getting her scheduled at the  soonest mutually convenient date.    Fredirick Maudlin 06/26/2019, 1:22 PM

## 2019-06-27 ENCOUNTER — Telehealth: Payer: Self-pay | Admitting: General Surgery

## 2019-06-27 NOTE — Telephone Encounter (Signed)
Pt has been advised of Pre-Admission date/time, COVID Testing date and Surgery date.  Surgery Date: 07/07/19 Preadmission Testing Date: 07/01/19 (phone 8a-1p) Covid Testing Date: 07/03/19 - patient advised to go to [facility] ([address] [facility]) between 8a-1p   Patient has been made aware to call [phone], between 1-3:00pm the day before surgery, to find out what time to arrive for surgery.

## 2019-06-30 ENCOUNTER — Other Ambulatory Visit: Payer: Self-pay | Admitting: General Surgery

## 2019-06-30 DIAGNOSIS — E042 Nontoxic multinodular goiter: Secondary | ICD-10-CM

## 2019-07-01 ENCOUNTER — Inpatient Hospital Stay
Admission: RE | Admit: 2019-07-01 | Discharge: 2019-07-01 | Disposition: A | Discharge status: Home / Self Care | Payer: Medicaid Other | Source: Ambulatory Visit

## 2019-07-01 HISTORY — DX: Hypothyroidism, unspecified: E03.9

## 2019-07-01 NOTE — Pre-Procedure Instructions (Signed)
Spoke with Britta Mccreedy at [facility] to inform the office that I have been unable to reach patient.  She will also try to reach out to her and have the patient call us!!

## 2019-07-01 NOTE — Pre-Procedure Instructions (Signed)
I have attempted to contact patient via phone for her preop interview.  3 attempts have gone to voice mail and I have left a message for her to call us back. Will try again tomorrow.

## 2019-07-02 ENCOUNTER — Encounter: Admission: RE | Admit: 2019-07-02 | Payer: Medicaid Other | Source: Ambulatory Visit

## 2019-07-02 ENCOUNTER — Telehealth: Payer: Self-pay | Admitting: General Surgery

## 2019-07-02 NOTE — Telephone Encounter (Signed)
On 07/01/19 received call from nurse [place] in [place]. Numerous calls have been made in attempt to do her preadmission screening.  Messages have been left urgent for patient to call as she is pending surgery on 07/07/19.  Checked DPR and also left message on her husband's number as well for patient to call.

## 2019-07-03 ENCOUNTER — Other Ambulatory Visit: Admission: RE | Admit: 2019-07-03 | Payer: Medicaid Other | Source: Ambulatory Visit

## 2019-07-03 ENCOUNTER — Encounter
Admission: RE | Admit: 2019-07-03 | Discharge: 2019-07-03 | Disposition: A | Discharge status: Home / Self Care | Payer: Medicaid Other | Source: Ambulatory Visit | Attending: General Surgery | Admitting: General Surgery

## 2019-07-03 ENCOUNTER — Other Ambulatory Visit: Payer: Self-pay

## 2019-07-03 DIAGNOSIS — Z01818 Encounter for other preprocedural examination: Secondary | ICD-10-CM | POA: Diagnosis not present

## 2019-07-03 DIAGNOSIS — E042 Nontoxic multinodular goiter: Secondary | ICD-10-CM | POA: Insufficient documentation

## 2019-07-03 HISTORY — DX: Essential (primary) hypertension: I10

## 2019-07-03 HISTORY — DX: Anxiety disorder, unspecified: F41.9

## 2019-07-03 HISTORY — DX: Depression, unspecified: F32.A

## 2019-07-03 NOTE — Patient Instructions (Signed)
INSTRUCTIONS FOR SURGERY     Your surgery is scheduled for:   Monday, MAY 10TH     To find out your arrival time for the day of surgery,          please call [phone] between 1 pm and 3 pm on :  Friday, MAY 7TH     When you arrive for surgery, report to the [facility].       Do NOT stop on the first floor to register.    REMEMBER: Instructions that are not followed completely may result in serious medical risk,  up to and including death, or upon the discretion of your surgeon and anesthesiologist,            your surgery may need to be rescheduled.  __X__ 1. Do not eat food after midnight the night before your procedure.                    No gum, candy, lozenger, tic tacs, tums or hard candies.                  ABSOLUTELY NOTHING SOLID IN YOUR MOUTH AFTER MIDNIGHT                    You may drink unlimited clear liquids up to 2 hours before you are scheduled to arrive for surgery.                   Do not drink anything within those 2 hours unless you need to take medicine, then take the                   smallest amount you need.  Clear liquids include:  water, apple juice without pulp,                   any flavor Gatorade, Black coffee, black tea.  Sugar may be added but no dairy/ honey /lemon.                        Broth and jello is not considered a clear liquid.  __x__  2. On the morning of surgery, please brush your teeth with toothpaste and water. You may rinse with                  mouthwash if you wish but DO NOT SWALLOW TOOTHPASTE OR MOUTHWASH  __X___3. NO alcohol for 24 hours before or after surgery.  __x___ 4.  Do NOT smoke or use e-cigarettes for 24 HOURS PRIOR TO SURGERY.                      DO NOT Use any chewable tobacco products for at least 6 hours prior to surgery.  __x___ 5. If you start any new medication after this appointment and prior to surgery, please                    Bring it with you on the day of surgery.  ___x__ 6. Notify your doctor if there is any change in  your medical condition, such as fever,                  infection, vomitting, diarrhea or any open sores.  __x___ 7.  USE the CHG SOAP as instructed, the night before surgery and the day of surgery.                   Once you have washed with this soap, do NOT use any of the following: Powders, perfumes                    or lotions. Please do not wear make up, hairpins, clips or nail polish. You _may_   wear deodorant.                   Men may shave their face and neck.  Women need to shave 48 hours prior to surgery.                   DO NOT wear ANY jewelry on the day of surgery. If there are rings that are too tight to                    remove easily, please address this prior to the surgery day. Piercings need to be removed.                                                                     NO METAL ON YOUR BODY.                    Do NOT bring any valuables.  If you came to Pre-Admit testing then you will not need license,                     insurance card or credit card.  If you will be staying overnight, please either leave your things in                     the car or have your family be responsible for these items.                     [facility] IS NOT RESPONSIBLE FOR BELONGINGS OR VALUABLES.  ___X__ 8. DO NOT wear contact lenses on surgery day.  You may not have dentures,                     Hearing aides, contacts or glasses in the operating room. These items can be                    Placed in the Recovery Room to receive immediately after surgery.  __x___ 9. IF YOU ARE SCHEDULED TO GO HOME ON THE SAME DAY, YOU MUST                   Have someone to drive you home and to stay with you  for the first 24 hours.                    Have an arrangement prior to arriving on surgery day.  ___x__ 10. Take the following medications  on the morning of surgery with a sip of water:                               1. PROTONIX                     2.            _____ 11.  Follow any instructions provided to you by your surgeon.                        Such as enema, clear liquid bowel prep  __X__  12. STOP ALL ASPIRIN PRODUCTS IMMEDIATELY.                       THIS INCLUDES BC POWDERS / GOODIES POWDER  __x___ 13. STOP Anti-inflammatories as of: IMMEDIATELY                      This includes IBUPROFEN / MOTRIN / ADVIL / ALEVE/ NAPROXYN                    YOU MAY TAKE TYLENOL ANY TIME PRIOR TO SURGERY.  ______18. If staying overnight, please have appropriate shoes to wear to be able to walk around the unit.                   Wear clean and comfortable clothing to the hospital.  PLEASE REMEMBER TO BRING A PHONE CHARGER FOR YOUR CELL PHONE.

## 2019-07-03 NOTE — Pre-Procedure Instructions (Signed)
Attempted to contact Laurie Hall again today to remind her of her Covid test that was scheduled for today.  Laurie Hall has not shown for this.  Also in the message I asked for her to also call Dr. Katina Dung' office to let them know if she plans on going through with surgery on Monday, May 10th.

## 2019-07-04 ENCOUNTER — Other Ambulatory Visit
Admission: RE | Admit: 2019-07-04 | Discharge: 2019-07-04 | Disposition: A | Discharge status: Home / Self Care | Payer: Medicaid Other | Source: Ambulatory Visit | Attending: General Surgery | Admitting: General Surgery

## 2019-07-04 DIAGNOSIS — Z01812 Encounter for preprocedural laboratory examination: Secondary | ICD-10-CM | POA: Insufficient documentation

## 2019-07-04 DIAGNOSIS — Z20822 Contact with and (suspected) exposure to covid-19: Secondary | ICD-10-CM | POA: Diagnosis not present

## 2019-07-04 LAB — CBC
HCT: 41 % (ref 36.0–46.0)
Hemoglobin: 14.4 g/dL (ref 12.0–15.0)
MCH: 32.4 pg (ref 26.0–34.0)
MCHC: 35.1 g/dL (ref 30.0–36.0)
MCV: 92.1 fL (ref 80.0–100.0)
Platelets: 254 10*3/uL (ref 150–400)
RBC: 4.45 MIL/uL (ref 3.87–5.11)
RDW: 12.5 % (ref 11.5–15.5)
WBC: 6.8 10*3/uL (ref 4.0–10.5)
nRBC: 0 % (ref 0.0–0.2)

## 2019-07-04 LAB — BASIC METABOLIC PANEL
Anion gap: 8 (ref 5–15)
BUN: 15 mg/dL (ref 6–20)
CO2: 25 mmol/L (ref 22–32)
Calcium: 8.8 mg/dL — ABNORMAL LOW (ref 8.9–10.3)
Chloride: 103 mmol/L (ref 98–111)
Creatinine, Ser: 0.88 mg/dL (ref 0.44–1.00)
GFR calc Af Amer: >60 mL/min (ref >60)
GFR calc non Af Amer: >60 mL/min (ref >60)
Glucose, Bld: 108 mg/dL — ABNORMAL HIGH (ref 70–99)
Potassium: 3.8 mmol/L (ref 3.5–5.1)
Sodium: 136 mmol/L (ref 135–145)

## 2019-07-04 LAB — SARS CORONAVIRUS 2 (TAT 6-24 HRS): SARS Coronavirus 2: NEGATIVE

## 2019-07-07 ENCOUNTER — Encounter
Admission: RE | Disposition: A | Discharge status: Home / Self Care | Payer: Self-pay | Source: Home / Self Care | Attending: General Surgery

## 2019-07-07 ENCOUNTER — Ambulatory Visit: Payer: Medicaid Other | Admitting: Anesthesiology

## 2019-07-07 ENCOUNTER — Observation Stay
Admission: RE | Admit: 2019-07-07 | Discharge: 2019-07-08 | Disposition: A | Discharge status: Home / Self Care | Payer: Medicaid Other | Attending: General Surgery | Admitting: General Surgery

## 2019-07-07 ENCOUNTER — Other Ambulatory Visit: Payer: Self-pay

## 2019-07-07 ENCOUNTER — Encounter: Payer: Self-pay | Admitting: General Surgery

## 2019-07-07 DIAGNOSIS — E042 Nontoxic multinodular goiter: Principal | ICD-10-CM

## 2019-07-07 DIAGNOSIS — G4733 Obstructive sleep apnea (adult) (pediatric): Secondary | ICD-10-CM | POA: Diagnosis not present

## 2019-07-07 DIAGNOSIS — F329 Major depressive disorder, single episode, unspecified: Secondary | ICD-10-CM | POA: Insufficient documentation

## 2019-07-07 DIAGNOSIS — K219 Gastro-esophageal reflux disease without esophagitis: Secondary | ICD-10-CM | POA: Insufficient documentation

## 2019-07-07 DIAGNOSIS — F17210 Nicotine dependence, cigarettes, uncomplicated: Secondary | ICD-10-CM | POA: Diagnosis not present

## 2019-07-07 DIAGNOSIS — F419 Anxiety disorder, unspecified: Secondary | ICD-10-CM | POA: Insufficient documentation

## 2019-07-07 DIAGNOSIS — E039 Hypothyroidism, unspecified: Secondary | ICD-10-CM | POA: Insufficient documentation

## 2019-07-07 DIAGNOSIS — Z79899 Other long term (current) drug therapy: Secondary | ICD-10-CM | POA: Insufficient documentation

## 2019-07-07 DIAGNOSIS — E890 Postprocedural hypothyroidism: Secondary | ICD-10-CM | POA: Diagnosis present

## 2019-07-07 DIAGNOSIS — Z9889 Other specified postprocedural states: Secondary | ICD-10-CM

## 2019-07-07 HISTORY — PX: THYROIDECTOMY: SHX17

## 2019-07-07 LAB — CALCIUM: Calcium: 8.9 mg/dL (ref 8.9–10.3)

## 2019-07-07 LAB — POCT PREGNANCY, URINE: Preg Test, Ur: NEGATIVE

## 2019-07-07 LAB — ALBUMIN: Albumin: 3.8 g/dL (ref 3.5–5.0)

## 2019-07-07 SURGERY — THYROIDECTOMY
Anesthesia: General | Site: Neck

## 2019-07-07 MED ORDER — FENTANYL CITRATE (PF) 100 MCG/2ML IJ SOLN
INTRAMUSCULAR | Status: AC
  Filled 2019-07-07: qty 2

## 2019-07-07 MED ORDER — ONDANSETRON 4 MG PO TBDP: 4.0000 mg | ORAL_TABLET | Freq: Four times a day (QID) | ORAL | Status: DC | PRN

## 2019-07-07 MED ORDER — IBUPROFEN 400 MG PO TABS: 800.0000 mg | ORAL_TABLET | Freq: Three times a day (TID) | ORAL | Status: DC | PRN

## 2019-07-07 MED ORDER — ROCURONIUM BROMIDE 100 MG/10ML IV SOLN
INTRAVENOUS | Status: DC | PRN
  Administered 2019-07-07: 40 mg via INTRAVENOUS

## 2019-07-07 MED ORDER — REMIFENTANIL HCL 1 MG IV SOLR
INTRAVENOUS | Status: AC
  Filled 2019-07-07: qty 1000

## 2019-07-07 MED ORDER — ONDANSETRON HCL 4 MG/2ML IJ SOLN: 4.0000 mg | Freq: Once | INTRAMUSCULAR | Status: AC | PRN

## 2019-07-07 MED ORDER — NAPROXEN 500 MG PO TABS: 500.0000 mg | ORAL_TABLET | Freq: Two times a day (BID) | ORAL | Status: DC | PRN

## 2019-07-07 MED ORDER — SUGAMMADEX SODIUM 200 MG/2ML IV SOLN
INTRAVENOUS | Status: DC | PRN
  Administered 2019-07-07: 195 mg via INTRAVENOUS

## 2019-07-07 MED ORDER — "VISTASEAL 4 ML SINGLE DOSE KIT "
PACK | CUTANEOUS | Status: DC | PRN
  Administered 2019-07-07 (×2): 4 mL via TOPICAL

## 2019-07-07 MED ORDER — HYDROMORPHONE HCL 1 MG/ML IJ SOLN
INTRAMUSCULAR | Status: AC
  Filled 2019-07-07: qty 1

## 2019-07-07 MED ORDER — HYDROMORPHONE HCL 1 MG/ML IJ SOLN
INTRAMUSCULAR | Status: DC | PRN
  Administered 2019-07-07: 1 mg via INTRAVENOUS

## 2019-07-07 MED ORDER — TRAMADOL HCL 50 MG PO TABS: 50.0000 mg | ORAL_TABLET | Freq: Four times a day (QID) | ORAL | Status: DC | PRN

## 2019-07-07 MED ORDER — CALCIUM CARBONATE 1250 (500 CA) MG PO TABS
500.0000 mg | ORAL_TABLET | Freq: Three times a day (TID) | ORAL | Status: DC
  Administered 2019-07-08: 500 mg via ORAL
  Filled 2019-07-07 (×6): qty 1

## 2019-07-07 MED ORDER — SIMETHICONE 80 MG PO CHEW: 40.0000 mg | CHEWABLE_TABLET | Freq: Four times a day (QID) | ORAL | Status: DC | PRN

## 2019-07-07 MED ORDER — MENTHOL 3 MG MT LOZG
1.0000 | LOZENGE | OROMUCOSAL | Status: DC | PRN
  Administered 2019-07-07: 3 mg via ORAL
  Filled 2019-07-07 (×3): qty 9

## 2019-07-07 MED ORDER — ONDANSETRON HCL 4 MG/2ML IJ SOLN: 4.0000 mg | Freq: Four times a day (QID) | INTRAMUSCULAR | Status: DC | PRN

## 2019-07-07 MED ORDER — ONDANSETRON HCL 4 MG/2ML IJ SOLN
INTRAMUSCULAR | Status: AC
  Administered 2019-07-07: 4 mg via INTRAVENOUS
  Filled 2019-07-07: qty 2

## 2019-07-07 MED ORDER — LACTATED RINGERS IV SOLN
INTRAVENOUS | Status: DC | PRN
Start: 2019-07-07 — End: 2019-07-07

## 2019-07-07 MED ORDER — PHENYLEPHRINE HCL (PRESSORS) 10 MG/ML IV SOLN
INTRAVENOUS | Status: AC
  Filled 2019-07-07: qty 1

## 2019-07-07 MED ORDER — PANTOPRAZOLE SODIUM 40 MG PO TBEC
40.0000 mg | DELAYED_RELEASE_TABLET | Freq: Every day | ORAL | Status: DC
  Administered 2019-07-08: 40 mg via ORAL
  Filled 2019-07-07: qty 1

## 2019-07-07 MED ORDER — LIDOCAINE HCL (CARDIAC) PF 100 MG/5ML IV SOSY
PREFILLED_SYRINGE | INTRAVENOUS | Status: DC | PRN
  Administered 2019-07-07: 100 mg via INTRAVENOUS

## 2019-07-07 MED ORDER — PROMETHAZINE HCL 25 MG/ML IJ SOLN
12.5000 mg | Freq: Four times a day (QID) | INTRAMUSCULAR | Status: DC | PRN
  Administered 2019-07-07: 12.5 mg via INTRAVENOUS
  Filled 2019-07-07: qty 1

## 2019-07-07 MED ORDER — CHLORHEXIDINE GLUCONATE CLOTH 2 % EX PADS: 6.0000 | MEDICATED_PAD | Freq: Once | CUTANEOUS | Status: DC

## 2019-07-07 MED ORDER — OXYCODONE HCL 5 MG PO TABS: 5.0000 mg | ORAL_TABLET | Freq: Once | ORAL | Status: DC | PRN

## 2019-07-07 MED ORDER — HYDROMORPHONE HCL 1 MG/ML IJ SOLN: 0.5000 mg | INTRAMUSCULAR | Status: DC | PRN

## 2019-07-07 MED ORDER — OXYCODONE HCL 5 MG PO TABS
5.0000 mg | ORAL_TABLET | ORAL | Status: DC | PRN
  Administered 2019-07-07 – 2019-07-08 (×2): 10 mg via ORAL
  Filled 2019-07-07 (×2): qty 2

## 2019-07-07 MED ORDER — PROPOFOL 500 MG/50ML IV EMUL
INTRAVENOUS | Status: DC | PRN
Start: 2019-07-07 — End: 2019-07-07
  Administered 2019-07-07: 30 ug/kg/min via INTRAVENOUS

## 2019-07-07 MED ORDER — LEVOTHYROXINE SODIUM 50 MCG PO TABS
150.0000 ug | ORAL_TABLET | Freq: Every day | ORAL | Status: DC
  Administered 2019-07-08: 150 ug via ORAL
  Filled 2019-07-07: qty 1

## 2019-07-07 MED ORDER — FENTANYL CITRATE (PF) 100 MCG/2ML IJ SOLN
INTRAMUSCULAR | Status: AC
  Administered 2019-07-07: 25 ug via INTRAVENOUS
  Filled 2019-07-07: qty 2

## 2019-07-07 MED ORDER — FENTANYL CITRATE (PF) 100 MCG/2ML IJ SOLN
INTRAMUSCULAR | Status: DC | PRN
  Administered 2019-07-07: 100 ug via INTRAVENOUS
  Administered 2019-07-07 (×2): 50 ug via INTRAVENOUS

## 2019-07-07 MED ORDER — EPHEDRINE 5 MG/ML INJ
INTRAVENOUS | Status: AC
  Filled 2019-07-07: qty 10

## 2019-07-07 MED ORDER — SEVOFLURANE IN SOLN
RESPIRATORY_TRACT | Status: AC
  Filled 2019-07-07: qty 250

## 2019-07-07 MED ORDER — PROPOFOL 10 MG/ML IV BOLUS
INTRAVENOUS | Status: DC | PRN
  Administered 2019-07-07: 40 mg via INTRAVENOUS
  Administered 2019-07-07: 200 mg via INTRAVENOUS
  Administered 2019-07-07: 30 mg via INTRAVENOUS

## 2019-07-07 MED ORDER — PROPOFOL 500 MG/50ML IV EMUL
INTRAVENOUS | Status: AC
  Filled 2019-07-07: qty 50

## 2019-07-07 MED ORDER — SUCCINYLCHOLINE CHLORIDE 20 MG/ML IJ SOLN
INTRAMUSCULAR | Status: DC | PRN
  Administered 2019-07-07: 140 mg via INTRAVENOUS

## 2019-07-07 MED ORDER — DIPHENHYDRAMINE HCL 25 MG PO CAPS: 25.0000 mg | ORAL_CAPSULE | Freq: Four times a day (QID) | ORAL | Status: DC | PRN

## 2019-07-07 MED ORDER — DEXAMETHASONE SODIUM PHOSPHATE 10 MG/ML IJ SOLN
INTRAMUSCULAR | Status: AC
  Filled 2019-07-07: qty 1

## 2019-07-07 MED ORDER — FENTANYL CITRATE (PF) 100 MCG/2ML IJ SOLN
25.0000 ug | INTRAMUSCULAR | Status: AC | PRN
  Administered 2019-07-07 (×4): 25 ug via INTRAVENOUS

## 2019-07-07 MED ORDER — SODIUM CHLORIDE 0.9 % IV SOLN
INTRAVENOUS | Status: DC | PRN
  Administered 2019-07-07: .05 ug/kg/min via INTRAVENOUS

## 2019-07-07 MED ORDER — LACTATED RINGERS IV SOLN: INTRAVENOUS | Status: DC

## 2019-07-07 MED ORDER — SODIUM CHLORIDE 0.9 % IV SOLN
INTRAVENOUS | Status: DC | PRN
  Administered 2019-07-07: 20 ug/min via INTRAVENOUS

## 2019-07-07 MED ORDER — MIDAZOLAM HCL 2 MG/2ML IJ SOLN
INTRAMUSCULAR | Status: AC
  Filled 2019-07-07: qty 2

## 2019-07-07 MED ORDER — HYDROMORPHONE HCL 1 MG/ML IJ SOLN
INTRAMUSCULAR | Status: AC
  Administered 2019-07-07: 0.5 mg via INTRAVENOUS
  Filled 2019-07-07: qty 1

## 2019-07-07 MED ORDER — MIDAZOLAM HCL 2 MG/2ML IJ SOLN
INTRAMUSCULAR | Status: DC | PRN
  Administered 2019-07-07: 2 mg via INTRAVENOUS

## 2019-07-07 MED ORDER — FENTANYL CITRATE (PF) 100 MCG/2ML IJ SOLN
25.0000 ug | INTRAMUSCULAR | Status: AC | PRN
  Administered 2019-07-07 (×2): 25 ug via INTRAVENOUS

## 2019-07-07 MED ORDER — LIDOCAINE HCL (PF) 2 % IJ SOLN
INTRAMUSCULAR | Status: AC
  Filled 2019-07-07: qty 5

## 2019-07-07 MED ORDER — CELECOXIB 200 MG PO CAPS
200.0000 mg | ORAL_CAPSULE | Freq: Two times a day (BID) | ORAL | Status: DC
  Administered 2019-07-08: 200 mg via ORAL
  Filled 2019-07-07 (×3): qty 1

## 2019-07-07 MED ORDER — DEXAMETHASONE SODIUM PHOSPHATE 10 MG/ML IJ SOLN
INTRAMUSCULAR | Status: DC | PRN
  Administered 2019-07-07: 10 mg via INTRAVENOUS

## 2019-07-07 MED ORDER — HEMOSTATIC AGENTS (NO CHARGE) OPTIME
TOPICAL | Status: DC | PRN
  Administered 2019-07-07 (×2): 1 via TOPICAL

## 2019-07-07 MED ORDER — ONDANSETRON HCL 4 MG/2ML IJ SOLN
INTRAMUSCULAR | Status: AC
  Filled 2019-07-07: qty 2

## 2019-07-07 MED ORDER — DEXTROSE IN LACTATED RINGERS 5 % IV SOLN: INTRAVENOUS | Status: DC

## 2019-07-07 MED ORDER — ONDANSETRON HCL 4 MG/2ML IJ SOLN
INTRAMUSCULAR | Status: DC | PRN
  Administered 2019-07-07: 4 mg via INTRAVENOUS

## 2019-07-07 MED ORDER — OXYCODONE HCL 5 MG/5ML PO SOLN: 5.0000 mg | Freq: Once | ORAL | Status: DC | PRN

## 2019-07-07 SURGICAL SUPPLY — 46 items
ADH SKN CLS APL DERMABOND .7 (GAUZE/BANDAGES/DRESSINGS) ×1
BACTOSHIELD CHG 4% 4OZ (MISCELLANEOUS) ×1
BASIN GRAD PLASTIC 32OZ STRL (MISCELLANEOUS) ×2 IMPLANT
BLADE SURG 15 STRL LF DISP TIS (BLADE) ×1 IMPLANT
BLADE SURG 15 STRL SS (BLADE) ×2
CANISTER SUCT 1200ML W/VALVE (MISCELLANEOUS) ×2 IMPLANT
CLIP VESOCCLUDE SM WIDE 6/CT (CLIP) ×2 IMPLANT
COVER WAND RF STERILE (DRAPES) ×2 IMPLANT
DERMABOND ADVANCED (GAUZE/BANDAGES/DRESSINGS) ×1
DERMABOND ADVANCED .7 DNX12 (GAUZE/BANDAGES/DRESSINGS) ×1 IMPLANT
DRAPE MAG INST 16X20 L/F (DRAPES) ×2 IMPLANT
DRAPE THYROID T SHEET (DRAPES) ×2 IMPLANT
ELECT CAUTERY BLADE TIP 2.5 (TIP) ×2
ELECT LARYNGEAL DUAL CHAN (ELECTRODE) ×1 IMPLANT
ELECT NEEDLE 20X.3 GREEN (MISCELLANEOUS) ×2
ELECT REM PT RETURN 9FT ADLT (ELECTROSURGICAL) ×2
ELECTRODE CAUTERY BLDE TIP 2.5 (TIP) ×1 IMPLANT
ELECTRODE NDL 20X.3 GREEN (MISCELLANEOUS) IMPLANT
ELECTRODE NEEDLE 20X.3 GREEN (MISCELLANEOUS) ×1 IMPLANT
ELECTRODE REM PT RTRN 9FT ADLT (ELECTROSURGICAL) ×1 IMPLANT
GAUZE 4X4 16PLY RFD (DISPOSABLE) ×1 IMPLANT
GLOVE BIO SURGEON STRL SZ 6.5 (GLOVE) ×4 IMPLANT
GLOVE INDICATOR 7.0 STRL GRN (GLOVE) ×2 IMPLANT
GOWN STRL REUS W/ TWL LRG LVL3 (GOWN DISPOSABLE) ×2 IMPLANT
GOWN STRL REUS W/TWL LRG LVL3 (GOWN DISPOSABLE) ×4
HEMOSTAT SNOW SURGICEL 2X4 (HEMOSTASIS) ×3 IMPLANT
KIT TURNOVER KIT A (KITS) ×2 IMPLANT
LABEL OR SOLS (LABEL) ×2 IMPLANT
NERVE STIMULATOR WAVEFORM 16S (NEUROSURGERY SUPPLIES) ×2
NS IRRIG 500ML POUR BTL (IV SOLUTION) ×2 IMPLANT
PACK BASIN MINOR (MISCELLANEOUS) ×2 IMPLANT
PROBE NEUROSIGN BIPOL (MISCELLANEOUS) IMPLANT
PROBE NEUROSIGN BIPOLAR (MISCELLANEOUS) ×2
SCRUB CHG 4% DYNA-HEX 4OZ (MISCELLANEOUS) ×1 IMPLANT
SHEARS HARMONIC 9CM CVD (BLADE) ×1 IMPLANT
SPONGE KITTNER 5P (MISCELLANEOUS) ×3 IMPLANT
SPONGE XRAY 4X4 16PLY STRL (MISCELLANEOUS) ×1 IMPLANT
STIMULATOR NERVE WAVEFORM 16S (NEUROSURGERY SUPPLIES) IMPLANT
STRIP CLOSURE SKIN 1/2X4 (GAUZE/BANDAGES/DRESSINGS) ×2 IMPLANT
SUT MNCRL AB 4-0 PS2 18 (SUTURE) ×1 IMPLANT
SUT PROLENE 4 0 PS 2 18 (SUTURE) ×2 IMPLANT
SUT SILK 2 0 (SUTURE) ×6
SUT SILK 2-0 18XBRD TIE 12 (SUTURE) ×1 IMPLANT
SUT VIC AB 4-0 RB1 27 (SUTURE) ×2
SUT VIC AB 4-0 RB1 27X BRD (SUTURE) ×1 IMPLANT
SYR BULB IRRIG 60ML STRL (SYRINGE) ×2 IMPLANT

## 2019-07-07 NOTE — Interval H&P Note (Signed)
History and Physical Interval Note:  07/07/2019 2:24 PM  Laurie Hall  has presented today for surgery, with the diagnosis of Multinodular goiter.  The various methods of treatment have been discussed with the patient and family. After consideration of risks, benefits and other options for treatment, the patient has consented to  Procedure(s): TOTAL THYROIDECTOMY (N/A) as a surgical intervention.  The patient's history has been reviewed, patient examined, no change in status, stable for surgery.  I have reviewed the patient's chart and labs.  Questions were answered to the patient's satisfaction.     Duanne Guess

## 2019-07-07 NOTE — Anesthesia Preprocedure Evaluation (Signed)
Anesthesia Evaluation  Patient identified by MRN, date of birth, ID band Patient awake    Reviewed: Allergy & Precautions, NPO status , Patient's Chart, lab work & pertinent test results  History of Anesthesia Complications Negative for: history of anesthetic complications  Airway Mallampati: III  TM Distance: >3 FB Neck ROM: Full    Dental no notable dental hx. (+) Teeth Intact, Dental Advisory Given   Pulmonary sleep apnea , neg COPD, Current Smoker and Patient abstained from smoking.,  OSA symptoms thought by MDs to be due to extrinsic compression from large thyroid. Patient says sometimes she feels it is more difficult to breathe when she lies down, but not all the time. CT scan shows some mild  narrowing and leftward deviation of trachea, about 92mm at its widest.   Pulmonary exam normal breath sounds clear to auscultation       Cardiovascular Exercise Tolerance: Good METShypertension, (-) CAD and (-) Past MI (-) dysrhythmias  Rhythm:Regular Rate:Normal - Systolic murmurs    Neuro/Psych PSYCHIATRIC DISORDERS Anxiety Depression negative neurological ROS     GI/Hepatic GERD  Medicated,(+)     (-) substance abuse  ,   Endo/Other  neg diabetesHypothyroidism   Renal/GU negative Renal ROS     Musculoskeletal   Abdominal   Peds  Hematology   Anesthesia Other Findings Past Medical History: No date: Abnormal Pap smear     Comment:  x4, repeats ok No date: Allergy No date: Anxiety No date: Depression 04/2019: Dysphagia     Comment:  d/t thyroid nodules No date: GERD (gastroesophageal reflux disease) No date: Heart palpitations 04/2019: History of paroxysmal nocturnal dyspnea No date: Hypertension     Comment:  taken off meds 7 years ago No date: Hypothyroidism No date: Meningitis No date: Ovarian cyst No date: Thyroid disease No date: Urinary tract infection  Reproductive/Obstetrics                              Anesthesia Physical Anesthesia Plan  ASA: II  Anesthesia Plan: General   Post-op Pain Management:    Induction: Intravenous  PONV Risk Score and Plan: 3 and Ondansetron, Dexamethasone, TIVA, Propofol infusion and Midazolam  Airway Management Planned: Oral ETT and Video Laryngoscope Planned  Additional Equipment: None  Intra-op Plan:   Post-operative Plan: Extubation in OR  Informed Consent: I have reviewed the patients History and Physical, chart, labs and discussed the procedure including the risks, benefits and alternatives for the proposed anesthesia with the patient or authorized representative who has indicated his/her understanding and acceptance.     Dental advisory given  Plan Discussed with: CRNA and Surgeon  Anesthesia Plan Comments: (Discussed risks of anesthesia with patient, including PONV, sore throat, lip/dental damage. Rare risks discussed as well, such as difficulty with intubation, cardiorespiratory and neurological sequelae. Patient understands.)        Anesthesia Quick Evaluation

## 2019-07-07 NOTE — Anesthesia Postprocedure Evaluation (Signed)
Anesthesia Post Note  Patient: Laurie Hall  Procedure(s) Performed: TOTAL THYROIDECTOMY (N/A Neck)  Patient location during evaluation: PACU Anesthesia Type: General Level of consciousness: awake and alert Pain management: pain level controlled Vital Signs Assessment: post-procedure vital signs reviewed and stable Respiratory status: spontaneous breathing, nonlabored ventilation, respiratory function stable and patient connected to nasal cannula oxygen Cardiovascular status: blood pressure returned to baseline and stable Postop Assessment: no apparent nausea or vomiting Anesthetic complications: no     Last Vitals:  Vitals:   07/07/19 2013 07/07/19 2143  BP: 134/85 (!) 129/91  Pulse: 75 76  Resp: 14 16  Temp: 36.7 C 36.8 C  SpO2: 100% 100%    Last Pain:  Vitals:   07/07/19 2143  TempSrc: Oral  PainSc:                  Lenard Simmer

## 2019-07-07 NOTE — Op Note (Signed)
Operative Note  Preoperative Diagnosis:  a multinodular goiter  Postoperative Diagnosis:  a multinodular goiter  Operation:  Total Thyroidectomy with Cervical Extraction of Substernal Component  Surgeon: Fredirick Maudlin, MD  Assistant: Nestor Lewandowsky, MD (a second surgeon was necessary to assist with exposure and due to the technical complexity of the case)  Anesthesia: General endotracheal with nerve monitoring system  Findings: A massively enlarged thyroid gland, right side greater than left causing leftward tracheal deviation. The thyroid extended beneath the sternum and clavicle on the right and also wraparound posterior to the trachea. Both recurrent laryngeal nerves were well visualized and gave excellent functional signals throughout the case. No parathyroid tissue was distinctly seen; none was seen on the surgical specimen once it was removed. No concern for tracheomalacia.  Indications: This is a 34 year old woman who has had a multinodular goiter for a number of years. It has continued to increase in size and has been causing her worsening compressive symptoms. She was interested in surgical removal. The risks of the operation were discussed with her and she agreed to proceed.  Procedure In Detail: The patient was identified in the preoperative holding area and brought to the operating room where she was placed supine on the OR table. All bony prominences were padded and bilateral sequential compression devices were placed on the lower extremities. General endotracheal anesthesia was induced using the nerve monitoring system. Tube placement was verified with the McGrath laryngoscope. The grounding lead was placed. The patient was then positioned appropriately for the operation and sterilely prepped and draped in standard fashion. A timeout was performed confirming the patient's identity, the procedure being performed, her allergies, all necessary equipment was available, and that  maintenance anesthesia was adequate.  An 8 cm transverse incision was made in a natural skin fold that was appropriately positioned for the operation. This was carried down through the subcutaneous tissues and platysma using electrocautery. Subplatysmal flaps were elevated. The strap muscles were divided in the median raphae. We elevated the strap muscles off of the right lobe of the thyroid and retracted them laterally. In order to gain exposure to the superior pole vessels, the omohyoid muscle was divided with the harmonic scalpel. We then sequentially isolated and divided the superior pole vessels with silk ties and the harmonic scalpel. We carefully continued to dissect circumferentially around the massively enlarged lobe. The middle thyroid vein was ligated and divided. A finger sweep beneath the clavicle and sternum was used to elevate the entirety of the lobe up into the surgical wound. We dissected into the lateral fibrofatty tissues of the central neck and identified the recurrent laryngeal nerve. It gave a good signal when stimulated. The trachea was exposed medial to the nerve and the inferior pole vessels were divided. The nerve was carefully dissected up to its insertion point at the cricopharyngeal muscle. The ligament of Berry and anterior suspensory ligament were divided. The gland was dissected off of the trachea and across the midline.  We then turned to the left, where we proceeded similarly. This lobe was markedly smaller than the right. The strap muscles were elevated off of the thyroid tissue. The superior pole vessels were isolated and divided with silk ties and the harmonic scalpel. The middle thyroid vein was divided. The thyroid was rotated medially. The tracheoesophageal groove was opened and the recurrent laryngeal nerve was identified. It gave a good signal when stimulated. The trachea was exposed medial to the nerve and the inferior pole vessels were divided. The  nerve was dissected  up to its insertion point of the cricopharyngeal muscle. The remaining attachments of the thyroid to the trachea were divided and the gland was completely excised. It was meticulously examined for any adherent parathyroid tissue; none was seen. It was handed off as a specimen. The wound bed was copiously irrigated. Valsalva maneuvers from the anesthesia service confirmed no ongoing surgical bleeding. SNoW and Vistaseal were used for additional hemostasis. The strap muscles were closed in the midline with running 4-0 Vicryl. The platysma was closed with interrupted Vicryl. The skin was closed with running subcuticular Monocryl. The skin was cleaned. Dermabond and Steri-Strips were applied. The Monocryl suture was trimmed at the skin surface. The patient was awakened, extubated, and taken to the postanesthesia care unit in good condition.  EBL: 25 cc  IVF: See anesthesia record  Specimen(s): Total thyroid  Complications: none immediately apparent.   Counts: all needles, instruments, and sponges were counted and reported to be correct in number at the end of the case.   I was present for and participated in the entire operation.  Duanne Guess 6:21 PM

## 2019-07-07 NOTE — Anesthesia Procedure Notes (Signed)
Procedure Name: Intubation Date/Time: 07/07/2019 2:52 PM Performed by: Jannet Mantis, CRNA Pre-anesthesia Checklist: Patient identified, Emergency Drugs available, Patient being monitored and Suction available Patient Re-evaluated:Patient Re-evaluated prior to induction Oxygen Delivery Method: Circle system utilized Preoxygenation: Pre-oxygenation with 100% oxygen Induction Type: IV induction Ventilation: Mask ventilation without difficulty Laryngoscope Size: McGraph and 4 Grade View: Grade I Tube type: Oral Tube size: 7.5 mm Number of attempts: 1 Airway Equipment and Method: Stylet and Video-laryngoscopy

## 2019-07-07 NOTE — Transfer of Care (Signed)
Immediate Anesthesia Transfer of Care Note  Patient: Laurie Hall  Procedure(s) Performed: TOTAL THYROIDECTOMY (N/A Neck)  Patient Location: PACU  Anesthesia Type:General  Level of Consciousness: awake, alert  and oriented  Airway & Oxygen Therapy: Patient Spontanous Breathing and Patient connected to face mask oxygen  Post-op Assessment: Report given to RN and Post -op Vital signs reviewed and stable  Post vital signs: Reviewed and stable  Last Vitals:  Vitals Value Taken Time  BP 134/89 07/07/19 1823  Temp 36.2 C 07/07/19 1823  Pulse 97 07/07/19 1830  Resp 16 07/07/19 1830  SpO2 100 % 07/07/19 1830  Vitals shown include unvalidated device data.  Last Pain:  Vitals:   07/07/19 1823  TempSrc:   PainSc: Asleep         Complications: No apparent anesthesia complications

## 2019-07-08 ENCOUNTER — Other Ambulatory Visit: Payer: Self-pay

## 2019-07-08 DIAGNOSIS — E042 Nontoxic multinodular goiter: Secondary | ICD-10-CM | POA: Diagnosis not present

## 2019-07-08 LAB — ALBUMIN: Albumin: 3.5 g/dL (ref 3.5–5.0)

## 2019-07-08 LAB — CALCIUM: Calcium: 9 mg/dL (ref 8.9–10.3)

## 2019-07-08 MED ORDER — OXYCODONE HCL 5 MG PO TABS: 5.0000 mg | ORAL_TABLET | Freq: Four times a day (QID) | ORAL | 0 refills | Status: DC | PRN

## 2019-07-08 MED ORDER — CALCIUM CARBONATE 1250 (500 CA) MG PO TABS: 1.0000 | ORAL_TABLET | Freq: Three times a day (TID) | ORAL | 1 refills | Status: DC

## 2019-07-08 MED ORDER — IBUPROFEN 200 MG PO TABS: 800.0000 mg | ORAL_TABLET | Freq: Three times a day (TID) | ORAL | 0 refills | Status: DC | PRN

## 2019-07-08 MED ORDER — PANTOPRAZOLE SODIUM 40 MG PO TBEC: 40.0000 mg | DELAYED_RELEASE_TABLET | Freq: Every day | ORAL | 2 refills | Status: DC

## 2019-07-08 MED ORDER — DEXAMETHASONE SODIUM PHOSPHATE 10 MG/ML IJ SOLN
10.0000 mg | Freq: Once | INTRAMUSCULAR | Status: AC
  Administered 2019-07-08: 10 mg via INTRAVENOUS
  Filled 2019-07-08: qty 1

## 2019-07-08 MED ORDER — LEVOTHYROXINE SODIUM 150 MCG PO TABS: 150.0000 ug | ORAL_TABLET | Freq: Every day | ORAL | 3 refills | Status: DC

## 2019-07-08 NOTE — Discharge Instructions (Signed)

## 2019-07-08 NOTE — Progress Notes (Signed)
Ranya R Cimmino A and O x4. VSS. Pt tolerating diet well. No complaints of nausea or vomiting. IV removed intact, prescriptions given. Pt voices understanding of discharge instructions with no further questions. Patient discharged via wheelchair with NT  Allergies as of 07/08/2019      Reactions   Acetaminophen    Blisters in mouth and back/arms   Ibuprofen Other (See Comments)   Reaction not clear, allergy reported by RN on 07/07/19   Aspirin Rash      Medication List    TAKE these medications   calcium carbonate 1250 (500 Ca) MG tablet Commonly known as: OS-CAL - dosed in mg of elemental calcium Take 1 tablet (500 mg of elemental calcium total) by mouth in the morning, at noon, and at bedtime.   diphenhydrAMINE 25 MG tablet Commonly known as: BENADRYL Take 25 mg by mouth every 6 (six) hours as needed for itching or allergies.   ibuprofen 200 MG tablet Commonly known as: ADVIL Take 4 tablets (800 mg total) by mouth every 8 (eight) hours as needed (for pain.).   levothyroxine 150 MCG tablet Commonly known as: SYNTHROID Take 1 tablet (150 mcg total) by mouth daily at 6 (six) AM. Start taking on: Jul 09, 2019   oxyCODONE 5 MG immediate release tablet Commonly known as: Oxy IR/ROXICODONE Take 1 tablet (5 mg total) by mouth every 6 (six) hours as needed for severe pain or breakthrough pain.   pantoprazole 40 MG tablet Commonly known as: PROTONIX Take 40 mg by mouth daily before breakfast.       Vitals:   07/08/19 0920 07/08/19 1153  BP: 117/70 125/83  Pulse: 80 76  Resp: 14 15  Temp: 98.4 F (36.9 C) 98.9 F (37.2 C)  SpO2: 100% 99%    Bertha Stakes

## 2019-07-08 NOTE — Progress Notes (Signed)
Notified Dr. Lady Gary that pt had an episode of vomiting. Orders given: "She can have 12.5 mg phenergan IV q6h prn nausea." Pt feeling relief.

## 2019-07-08 NOTE — Discharge Summary (Addendum)
[facility] SURGICAL DISCHARGE SUMMARY  Patient ID: Laurie Hall MRN: [id number] DOB/AGE: 1985/07/29 34 y.o.  Admit date: 07/07/2019 Discharge date: 07/08/2019  Discharge Diagnoses Patient Active Problem List   Diagnosis Date Noted   S/P total thyroidectomy 07/07/2019   Multinodular goiter 07/03/2019   Subclinical hyperthyroidism 05/08/2012    Consultants None  Procedures 07/07/2019:  Total Thyroidectomy with Cervical Extraction of Substernal Component  HPI: This is a 34 year old woman who has had a multinodular goiter for a number of years. It has continued to increase in size and has been causing her worsening compressive symptoms. She presents to [facility] on 05/10 for Total Thyroidectomy with Cervical Extraction of Substernal Component with Dr Celine Ahr.   Hospital Course: Informed consent was obtained and documented, and patient underwent uneventful Total Thyroidectomy with Cervical Extraction of Substernal Component (Dr Celine Ahr, 07/07/2019).  Post-operatively, patient's pain/symptoms improved/resolved and advancement of patient's diet and ambulation were well-tolerated. The remainder of patient's hospital course was essentially unremarkable, and discharge planning was initiated accordingly with patient safely able to be discharged home with appropriate discharge instructions, pain control, and outpatient follow-up after all of her questions were answered to her expressed satisfaction.   Discharge Condition: Good   Physical Examination:  Constitutional: Well appearing female, NAD Pulmonary: Normal effort, no respiratory distress, she did have some hoarseness to her voice this morning, improved now, good phonation Skin: Anterior neck incision is CDI with steri-strips, no erythema or drainage   Allergies as of 07/08/2019       Reactions   Acetaminophen    Blisters in mouth and back/arms   Ibuprofen Other (See Comments)   Reaction not clear, allergy reported  by RN on 07/07/19   Aspirin Rash        Medication List     TAKE these medications    calcium carbonate 1250 (500 Ca) MG tablet Commonly known as: OS-CAL - dosed in mg of elemental calcium Take 1 tablet (500 mg of elemental calcium total) by mouth in the morning, at noon, and at bedtime.   diphenhydrAMINE 25 MG tablet Commonly known as: BENADRYL Take 25 mg by mouth every 6 (six) hours as needed for itching or allergies.   ibuprofen 200 MG tablet Commonly known as: ADVIL Take 4 tablets (800 mg total) by mouth every 8 (eight) hours as needed (for pain.).   levothyroxine 150 MCG tablet Commonly known as: SYNTHROID Take 1 tablet (150 mcg total) by mouth daily at 6 (six) AM. Start taking on: Jul 09, 2019   oxyCODONE 5 MG immediate release tablet Commonly known as: Oxy IR/ROXICODONE Take 1 tablet (5 mg total) by mouth every 6 (six) hours as needed for severe pain or breakthrough pain.   pantoprazole 40 MG tablet Commonly known as: PROTONIX Take 40 mg by mouth daily before breakfast.         Follow-up Information     Fredirick Maudlin, MD. Schedule an appointment as soon as possible for a visit in 2 week(s).   Specialty: General Surgery Why: s/p total thyroidectomy Contact information: [address] [place] [address] [phone]             Time spent on discharge management including discussion of hospital course, clinical condition, outpatient instructions, prescriptions, and follow up with the patient and members of the medical team: >30 minutes  -- Edison Simon , PA-C  [facility]  07/08/2019, 1:21 PM [phone] M-F: 7am - 4pm  I saw and evaluated the patient.  I agree with the above documentation, exam, and plan, which I have edited where appropriate. Duanne Guess  2:07 PM

## 2019-07-08 NOTE — Progress Notes (Deleted)
Notified Dr. Cannon that pt had an episode of vomiting. Orders given: "She can have 12.5 mg phenergan IV q6h prn nausea." Pt feeling relief.  

## 2019-07-09 LAB — SURGICAL PATHOLOGY

## 2019-07-15 ENCOUNTER — Telehealth: Payer: Self-pay | Admitting: General Surgery

## 2019-07-15 NOTE — Telephone Encounter (Signed)
Patient called and just has some questions she had surgery done on 5/10, patient had a total thyroidectomy please call patient and advise.

## 2019-07-15 NOTE — Telephone Encounter (Signed)
Spoke with patient and she wanted to know if it was ok for the steri strips to come off. Informed patient that the steri strips will fall on their own, but if she notices any concerns regarding the incision once the steri strips have come off, then give our office a call back. Patient verbalized understanding and has no further questions.

## 2019-07-22 ENCOUNTER — Encounter: Payer: Self-pay | Admitting: General Surgery

## 2019-07-22 ENCOUNTER — Other Ambulatory Visit: Payer: Self-pay

## 2019-07-22 ENCOUNTER — Ambulatory Visit (INDEPENDENT_AMBULATORY_CARE_PROVIDER_SITE_OTHER): Payer: Self-pay | Admitting: General Surgery

## 2019-07-22 ENCOUNTER — Other Ambulatory Visit
Admission: RE | Admit: 2019-07-22 | Discharge: 2019-07-22 | Disposition: A | Discharge status: Home / Self Care | Payer: Medicaid Other | Source: Ambulatory Visit | Attending: General Surgery | Admitting: General Surgery

## 2019-07-22 VITALS — BP 131/87 | HR 91 | Temp 97.9°F | Resp 15 | Ht 70.0 in | Wt 212.0 lb

## 2019-07-22 DIAGNOSIS — E042 Nontoxic multinodular goiter: Secondary | ICD-10-CM | POA: Insufficient documentation

## 2019-07-22 LAB — CALCIUM: Calcium: 9.1 mg/dL (ref 8.9–10.3)

## 2019-07-22 LAB — ALBUMIN: Albumin: 4 g/dL (ref 3.5–5.0)

## 2019-07-22 NOTE — Patient Instructions (Addendum)
Continue to take your calcium supplement. You will begin tapering it. Start with taking only twice a day. Call us if your symptoms worsen. After one week take only once a day.   We will have you check your Calcium level today. If it is normal you may go ahead and start tapering off your Calcium supplement. Go to [facility] entrance.  We will check your thyroid levels in about 6 weeks. You may have these done at [facility] entrance on or about July 1st. TSH, Free T4, Free T3   May rub Vitamin-E oil or other emmolient agent in area 2-3 times a day to soften. You will need to use sunscreen for the next year on the area to minimize altered pigmentation of the site.

## 2019-07-22 NOTE — Progress Notes (Signed)
Laurie Hall is here today for a postoperative visit.  She is a 34 year old woman who underwent a total thyroidectomy on Jul 07, 2019.  Her final pathology is as follows:  SURGICAL PATHOLOGY  CASE: [id number]  PATIENT: Laurie Hall  Surgical Pathology Report      Specimen Submitted:  A. Thyroid, total   Clinical History: Multinodular goiter     DIAGNOSIS:  A. THYROID GLAND; TOTAL THYROIDECTOMY:  - BENIGN THYROID TISSUE WITH MULTINODULAR HYPERPLASIA (MULTINODULAR  GOITER), WITH FOCAL DEGENERATIVE CHANGES, SUGGESTIVE OF PRIOR BIOPSY.  - NO PARATHYROID TISSUE IDENTIFIED.  - NEGATIVE FOR MALIGNANCY.   She has done well since her operation.  She denies any nausea or vomiting.  No fevers or chills.  Her voice is essentially normal and she denies any difficulty with swallowing.  She has been taking Os-Cal 1 tablet 3 times daily.  She does report occasional tingling or twitching at the corner of her mouth and in her left hand.  She has not taken additional calcium for the symptoms; she says that she just shakes her hand and it seems to resolve.  She also complains of total body aches that have been going on since her discharge from the hospital.  She is currently taking 150 mcg of levothyroxine daily and she confirms that she is taking this correctly.  Today's Vitals   07/22/19 1007  BP: 131/87  Pulse: 91  Resp: 15  Temp: 97.9 F (36.6 C)  Weight: 212 lb (96.2 kg)  Height: 5\' 10"  (1.778 m)   Body mass index is 30.42 kg/m.  Focused cervical examination: The Steri-Strips have fallen off, but glue was still present.  This was removed with an alcohol prep pad.  The transverse incision is healing nicely without erythema, induration, or drainage.  There is a normal healing ridge beneath the surface.  Impression and plan: This is a 34 year old woman who had a large multinodular goiter causing compressive symptoms.  She underwent a total thyroidectomy and is doing well.  Due to some  of her symptoms, however, I would like to check a calcium level today.  If it is within normal limits, then she may begin to taper her calcium off.  She was provided instructions for doing so.  She was also instructed in scar massage, using a topical emollient agent and pressure along the incision to help it soften, flatten, and fade.  She should use sunscreen for the next year to prevent altered pigmentation of the skin.  We will check thyroid function tests in 6 weeks to determine whether or not the current dose of thyroid hormone replacement is adequate.  She should also follow-up with her primary care provider or endocrinologist.  We will see her in clinic on an as-needed basis.

## 2019-08-05 ENCOUNTER — Telehealth: Payer: Self-pay

## 2019-08-05 NOTE — Telephone Encounter (Signed)
Spoke with the patient and per Dr Lady Gary her Calcium levels were normal and she can start to taper off of her Calcium supplements.

## 2019-08-21 ENCOUNTER — Other Ambulatory Visit: Payer: Self-pay

## 2019-08-21 DIAGNOSIS — E042 Nontoxic multinodular goiter: Secondary | ICD-10-CM

## 2019-09-02 ENCOUNTER — Telehealth: Payer: Self-pay

## 2019-09-02 NOTE — Telephone Encounter (Signed)
Called and left a message for the patient reminding her of her lab work that was due on July 1st. She may call back with any questions.

## 2019-11-07 ENCOUNTER — Other Ambulatory Visit: Payer: Self-pay

## 2019-11-07 ENCOUNTER — Other Ambulatory Visit
Admission: RE | Admit: 2019-11-07 | Discharge: 2019-11-07 | Disposition: A | Discharge status: Home / Self Care | Payer: Medicaid Other | Attending: General Surgery | Admitting: General Surgery

## 2019-11-07 DIAGNOSIS — E042 Nontoxic multinodular goiter: Secondary | ICD-10-CM | POA: Diagnosis present

## 2019-11-07 LAB — T4, FREE: Free T4: 0.75 ng/dL (ref 0.61–1.12)

## 2019-11-07 LAB — TSH: TSH: 2.046 u[IU]/mL (ref 0.350–4.500)

## 2019-11-08 LAB — T3, FREE: T3, Free: 2.6 pg/mL (ref 2.0–4.4)

## 2020-06-17 ENCOUNTER — Emergency Department: Payer: Medicaid Other

## 2020-06-17 ENCOUNTER — Other Ambulatory Visit: Payer: Self-pay

## 2020-06-17 ENCOUNTER — Emergency Department
Admission: EM | Admit: 2020-06-17 | Discharge: 2020-06-17 | Disposition: A | Discharge status: Home / Self Care | Payer: Medicaid Other | Attending: Physician Assistant | Admitting: Physician Assistant

## 2020-06-17 DIAGNOSIS — F17210 Nicotine dependence, cigarettes, uncomplicated: Secondary | ICD-10-CM | POA: Diagnosis not present

## 2020-06-17 DIAGNOSIS — W260XXA Contact with knife, initial encounter: Secondary | ICD-10-CM | POA: Insufficient documentation

## 2020-06-17 DIAGNOSIS — S61412A Laceration without foreign body of left hand, initial encounter: Secondary | ICD-10-CM | POA: Diagnosis not present

## 2020-06-17 DIAGNOSIS — Z79899 Other long term (current) drug therapy: Secondary | ICD-10-CM | POA: Insufficient documentation

## 2020-06-17 DIAGNOSIS — Z23 Encounter for immunization: Secondary | ICD-10-CM | POA: Insufficient documentation

## 2020-06-17 DIAGNOSIS — E039 Hypothyroidism, unspecified: Secondary | ICD-10-CM | POA: Diagnosis not present

## 2020-06-17 DIAGNOSIS — I10 Essential (primary) hypertension: Secondary | ICD-10-CM | POA: Insufficient documentation

## 2020-06-17 DIAGNOSIS — S60922A Unspecified superficial injury of left hand, initial encounter: Secondary | ICD-10-CM | POA: Diagnosis present

## 2020-06-17 MED ORDER — TRAMADOL HCL 50 MG PO TABS
50.0000 mg | ORAL_TABLET | Freq: Once | ORAL | Status: AC
  Administered 2020-06-17: 50 mg via ORAL
  Filled 2020-06-17: qty 1

## 2020-06-17 MED ORDER — OXYCODONE HCL 5 MG PO CAPS: 5.0000 mg | ORAL_CAPSULE | Freq: Four times a day (QID) | ORAL | 0 refills | Status: DC | PRN

## 2020-06-17 MED ORDER — OXYCODONE HCL 5 MG PO CAPS: 5.0000 mg | ORAL_CAPSULE | Freq: Four times a day (QID) | ORAL | 0 refills | Status: AC | PRN

## 2020-06-17 MED ORDER — TETANUS-DIPHTH-ACELL PERTUSSIS 5-2.5-18.5 LF-MCG/0.5 IM SUSY
0.5000 mL | PREFILLED_SYRINGE | Freq: Once | INTRAMUSCULAR | Status: AC
  Administered 2020-06-17: 0.5 mL via INTRAMUSCULAR
  Filled 2020-06-17: qty 0.5

## 2020-06-17 MED ORDER — LIDOCAINE HCL (PF) 1 % IJ SOLN
5.0000 mL | Freq: Once | INTRAMUSCULAR | Status: AC
  Administered 2020-06-17: 5 mL
  Filled 2020-06-17: qty 5

## 2020-06-17 NOTE — ED Triage Notes (Signed)
Pt to ED POV for laceration to left hand from steak knife. Small lac noted, no bleeding.

## 2020-06-17 NOTE — ED Notes (Addendum)
Pt consented to receiving discharge instructions. 

## 2020-06-17 NOTE — ED Provider Notes (Signed)
[facility] Emergency Department Provider Note   ____________________________________________   Event Date/Time   First MD Initiated Contact with Patient 06/17/20 1237     (approximate)  I have reviewed the triage vital signs and the nursing notes.   HISTORY  Chief Complaint Laceration    HPI Laurie Hall is a 35 y.o. female patient presents for puncture wound through the palmar aspect of the left hand with an exit wound to the dorsal aspect of the left hand.  Patient was trying to pry apart frozen biscuits.  Patient states tetanus shot is up-to-date.  Bleeding controlled direct pressure.  Denies loss sensation or loss of function.  Patient is right-hand dominant.         Past Medical History:  Diagnosis Date  . Abnormal Pap smear    x4, repeats ok  . Allergy   . Anxiety   . Depression   . Dysphagia 04/2019   d/t thyroid nodules  . GERD (gastroesophageal reflux disease)   . Heart palpitations   . History of paroxysmal nocturnal dyspnea 04/2019  . Hypertension    taken off meds 7 years ago  . Hypothyroidism   . Meningitis   . Ovarian cyst   . Thyroid disease   . Urinary tract infection     Patient Active Problem List   Diagnosis Date Noted  . S/P total thyroidectomy 07/07/2019  . Multinodular goiter 07/03/2019  . Vaginal bleeding 01/02/2013  . Threatened preterm labor, antepartum 08/13/2012  . Suspected fetal anomaly, antepartum-Ventriculomegaly in Twin B 05/23/2012  . Rubella non-immune status, antepartum 05/09/2012  . Subclinical hyperthyroidism 05/08/2012  . Twin pregnancy, antepartum 05/02/2012  . Previous cesarean delivery, antepartum condition or complication 05/02/2012  . Pregnancy with history of pre-term labor 05/02/2012  . Abnormal weight loss 05/02/2012  . TOBACCO DEPENDENCE 04/26/2006    Past Surgical History:  Procedure Laterality Date  . CESAREAN SECTION    . CESAREAN SECTION N/A 09/01/2012   Procedure:  Repeat cesarean section with delivery of Baby A girl at 1559. Baby B boy at 1600.;  Surgeon: Lesly Dukes, MD;  Location: [facility] ORS;  Service: Obstetrics;  Laterality: N/A;  . THERAPEUTIC ABORTION    . THYROIDECTOMY N/A 07/07/2019   Procedure: TOTAL THYROIDECTOMY;  Surgeon: Duanne Guess, MD;  Location: [facility] ORS;  Service: General;  Laterality: N/A;  . WISDOM TOOTH EXTRACTION      Prior to Admission medications   Medication Sig Start Date End Date Taking? Authorizing Provider  calcium carbonate (OS-CAL - DOSED IN MG OF ELEMENTAL CALCIUM) 1250 (500 Ca) MG tablet Take 1 tablet (500 mg of elemental calcium total) by mouth in the morning, at noon, and at bedtime. 07/08/19   Donovan Kail, PA-C  diphenhydrAMINE (BENADRYL) 25 MG tablet Take 25 mg by mouth every 6 (six) hours as needed for itching or allergies.    [provider]  esomeprazole (NEXIUM) 40 MG capsule Take 40 mg by mouth daily. 04/09/19   [provider]  ibuprofen (ADVIL) 200 MG tablet Take 4 tablets (800 mg total) by mouth every 8 (eight) hours as needed (for pain.). 07/08/19   Donovan Kail, PA-C  levothyroxine (SYNTHROID) 150 MCG tablet Take 1 tablet (150 mcg total) by mouth daily at 6 (six) AM. 07/09/19   Donovan Kail, PA-C  oxycodone (OXY-IR) 5 MG capsule Take 1 capsule (5 mg total) by mouth every 6 (six) hours as needed for up to 3 days. 06/17/20 06/20/20  Joni Reining, PA-C  pantoprazole (PROTONIX) 40 MG tablet Take 1 tablet (40 mg total) by mouth daily before breakfast. 07/08/19 08/11/21  Donovan Kail, PA-C    Allergies Acetaminophen, Ibuprofen, and Aspirin  Family History  Problem Relation Age of Onset  . Hyperthyroidism Mother   . Heart disease Mother   . Diabetes Mother   . Hyperthyroidism Maternal Grandfather   . Heart disease Maternal Grandfather   . Hyperthyroidism Maternal Aunt   . Hypothyroidism Maternal Grandmother   . Heart murmur Maternal Grandmother   . Diabetes Maternal  Grandmother   . Other Neg Hx     Social History Social History   Tobacco Use  . Smoking status: Current Every Day Smoker    Packs/day: 0.25    Years: 8.00    Pack years: 2.00    Types: Cigarettes  . Smokeless tobacco: Never Used  Vaping Use  . Vaping Use: Every day  Substance Use Topics  . Alcohol use: No    Comment: social  . Drug use: No    Review of Systems Constitutional: No fever/chills Eyes: No visual changes. ENT: No sore throat. Cardiovascular: Denies chest pain. Respiratory: Denies shortness of breath. Gastrointestinal: No abdominal pain.  No nausea, no vomiting.  No diarrhea.  No constipation. Genitourinary: Negative for dysuria. Musculoskeletal: Negative for back pain. Skin: Negative for rash.  Puncture wound left hand. Neurological: Negative for headaches, focal weakness or numbness. Psychiatric:  Anxiety depression. Endocrine:  Hypertension hypothyroidism. Hematological/Lymphatic:  Allergic/Immunilogical: Aspirin, ibuprofen, and Tylenol.  ____________________________________________   PHYSICAL EXAM:  VITAL SIGNS: ED Triage Vitals  Enc Vitals Group     BP 06/17/20 1224 (!) 144/94     Pulse Rate 06/17/20 1224 86     Resp 06/17/20 1224 16     Temp 06/17/20 1224 98.1 F (36.7 C)     Temp Source 06/17/20 1224 Oral     SpO2 06/17/20 1224 98 %     Weight 06/17/20 1225 210 lb (95.3 kg)     Height 06/17/20 1225 5\' 9"  (1.753 m)     Head Circumference --      Peak Flow --      Pain Score 06/17/20 1224 9     Pain Loc --      Pain Edu? --      Excl. in GC? --    Constitutional: Alert and oriented. Well appearing and in no acute distress. Cardiovascular: Normal rate, regular rhythm. Grossly normal heart sounds.  Good peripheral circulation. Respiratory: Normal respiratory effort.  No retractions. Lungs CTAB. Musculoskeletal: No lower extremity tenderness nor edema.  No joint effusions. Neurologic:  Normal speech and language. No gross focal neurologic  deficits are appreciated. No gait instability. Skin: 1 cm puncture wound palmar aspect of left hand.   Psychiatric: Mood and affect are normal. Speech and behavior are normal.  ____________________________________________   LABS (all labs ordered are listed, but only abnormal results are displayed)  Labs Reviewed - No data to display ____________________________________________  EKG   ____________________________________________  RADIOLOGY I, 06/19/20, personally viewed and evaluated these images (plain radiographs) as part of my medical decision making, as well as reviewing the written report by the radiologist.  ED MD interpretation: No acute findings on x-ray of the left hand.  Official radiology report(s): DG Hand Complete Left  Result Date: 06/17/2020 CLINICAL DATA:  Puncture wound LEFT hand at the fourth/fifth metacarpal area, cut hand with a steak knife while trying to separate 2  frozen biscuits EXAM: LEFT HAND - COMPLETE 3+ VIEW COMPARISON:  None FINDINGS: Osseous mineralization normal. Joint spaces preserved. No fracture, dislocation, or bone destruction. No radiopaque foreign bodies. IMPRESSION: No acute abnormalities. Electronically Signed   By: Ulyses SouthwardMark  Boles M.D.   On: 06/17/2020 13:49    ____________________________________________   PROCEDURES  Procedure(s) performed (including Critical Care):  Marland Kitchen.Marland Kitchen.Laceration Repair  Date/Time: 06/17/2020 2:25 PM Performed by: Joni ReiningSmith, Abraham Margulies K, PA-C Authorized by: Joni ReiningSmith, Aijalon Kirtz K, PA-C   Consent:    Consent obtained:  Verbal   Consent given by:  Patient   Risks, benefits, and alternatives were discussed: yes     Risks discussed:  Infection, pain, poor cosmetic result, need for additional repair, poor wound healing, vascular damage and nerve damage Universal protocol:    Procedure explained and questions answered to patient or proxy's satisfaction: yes     Imaging studies available: yes     Immediately prior to procedure,  a time out was called: yes     Patient identity confirmed:  Verbally with patient and arm band Anesthesia:    Anesthesia method:  Local infiltration   Local anesthetic:  Lidocaine 1% w/o epi Laceration details:    Location:  Hand   Hand location:  L palm   Length (cm):  1   Depth (mm):  1 Pre-procedure details:    Preparation:  Patient was prepped and draped in usual sterile fashion Exploration:    Hemostasis achieved with:  Direct pressure   Wound exploration: wound explored through full range of motion     Contaminated: no   Treatment:    Area cleansed with:  Povidone-iodine and saline   Amount of cleaning:  Extensive   Irrigation method:  Syringe   Visualized foreign bodies/material removed: no     Debridement:  None   Undermining:  None Skin repair:    Repair method:  Sutures   Suture size:  3-0   Suture material:  Nylon   Suture technique:  Simple interrupted and running   Number of sutures:  3 Approximation:    Approximation:  Close Repair type:    Repair type:  Simple Post-procedure details:    Dressing:  Antibiotic ointment and sterile dressing   Procedure completion:  Tolerated well, no immediate complications     ____________________________________________   INITIAL IMPRESSION / ASSESSMENT AND PLAN / ED COURSE  As part of my medical decision making, I reviewed the following data within the electronic MEDICAL RECORD NUMBER         Patient presents for puncture wound to palmar aspect of the left hand for small exit wound to the dorsal aspect of the left hand.  See procedure note for wound closure.  Advised patient to follow-up with [facility] hand [facility] secondary to the injuries and extra sustained.  Patient given discharge care instruction advised take pain medication as needed.  Wear hand splint until evaluation by Ortho.   ____________________________________________   FINAL CLINICAL IMPRESSION(S) / ED DIAGNOSES  Final diagnoses:  Laceration  of left hand without foreign body, initial encounter     ED Discharge Orders         Ordered    oxycodone (OXY-IR) 5 MG capsule  Every 6 hours PRN,   Status:  Discontinued        06/17/20 1422    oxycodone (OXY-IR) 5 MG capsule  Every 6 hours PRN        06/17/20 1432          *  Please note:  Laurie Hall was evaluated in Emergency Department on 06/17/2020 for the symptoms described in the history of present illness. She was evaluated in the context of the global COVID-19 pandemic, which necessitated consideration that the patient might be at risk for infection with the SARS-CoV-2 virus that causes COVID-19. Institutional protocols and algorithms that pertain to the evaluation of patients at risk for COVID-19 are in a state of rapid change based on information released by regulatory bodies including the CDC and federal and state organizations. These policies and algorithms were followed during the patient's care in the ED.  Some ED evaluations and interventions may be delayed as a result of limited staffing during and the pandemic.*   Note:  This document was prepared using Dragon voice recognition software and may include unintentional dictation errors.    Joni Reining, PA-C 06/17/20 1435    Concha Se, MD 06/18/20 [phone]

## 2020-06-17 NOTE — ED Notes (Signed)
Pt has been provided with discharge instructions. Pt denies any questions or concerns at this time. Pt verbalizes understanding for follow up care and d/c.  VSS.  Pt left department with all belongings.  

## 2020-06-17 NOTE — Discharge Instructions (Addendum)
Due to the entrance and exit wound of the left hand advised follow-up with hand surgeon at [facility].  Call tomorrow and tell them you are follow-up from the emergency room and they will schedule you for appointment.  Follow discharge care instructions.  Wear splint until evaluation by Ortho.

## 2020-09-16 ENCOUNTER — Telehealth: Payer: Self-pay | Admitting: Physician Assistant

## 2020-09-16 NOTE — Telephone Encounter (Signed)
Patient is calling and is asking for a refill on her Levothyroxine (Synthroid) patient is using [facility] on [address] on [address]. Please call patient and advise.

## 2020-09-16 NOTE — Telephone Encounter (Signed)
Received faxed refill for Levothyroine request -per Dr.Cannon request denied-needs to call PCP for refills.

## 2020-11-19 ENCOUNTER — Encounter: Payer: Self-pay | Admitting: General Surgery

## 2021-11-07 ENCOUNTER — Telehealth: Payer: Self-pay | Admitting: Obstetrics & Gynecology

## 2021-11-07 ENCOUNTER — Ambulatory Visit: Payer: Medicaid Other | Admitting: Obstetrics & Gynecology

## 2021-11-07 NOTE — Telephone Encounter (Signed)
Contacted pt to reschedule colpo that was scheduled with Dr. Marice Potter on 9/11 at 8:55.  The appt has been rescheduled to 9/26 at 1:55 with Dr. Marice Potter.

## 2021-11-22 ENCOUNTER — Ambulatory Visit: Payer: Medicaid Other | Admitting: Obstetrics & Gynecology

## 2021-12-06 ENCOUNTER — Encounter: Payer: Self-pay | Admitting: Obstetrics & Gynecology

## 2022-12-27 ENCOUNTER — Encounter: Payer: Self-pay | Admitting: Obstetrics and Gynecology

## 2022-12-27 ENCOUNTER — Other Ambulatory Visit (HOSPITAL_COMMUNITY)
Admission: RE | Admit: 2022-12-27 | Discharge: 2022-12-27 | Disposition: A | Discharge status: Home / Self Care | Payer: Medicaid Other | Source: Ambulatory Visit | Attending: Obstetrics and Gynecology | Admitting: Obstetrics and Gynecology

## 2022-12-27 ENCOUNTER — Ambulatory Visit (INDEPENDENT_AMBULATORY_CARE_PROVIDER_SITE_OTHER): Payer: Medicaid Other | Admitting: Obstetrics and Gynecology

## 2022-12-27 VITALS — BP 125/87 | HR 85 | Resp 16 | Ht 70.0 in | Wt 220.4 lb

## 2022-12-27 DIAGNOSIS — R87810 Cervical high risk human papillomavirus (HPV) DNA test positive: Secondary | ICD-10-CM | POA: Diagnosis present

## 2022-12-27 DIAGNOSIS — R87610 Atypical squamous cells of undetermined significance on cytologic smear of cervix (ASC-US): Secondary | ICD-10-CM

## 2022-12-27 DIAGNOSIS — D069 Carcinoma in situ of cervix, unspecified: Secondary | ICD-10-CM | POA: Diagnosis not present

## 2022-12-27 NOTE — Progress Notes (Signed)
     GYNECOLOGY OFFICE COLPOSCOPY PROCEDURE NOTE  37 y.o. [id number] here for colposcopy for ASCUS with POSITIVE high risk HPV pap smear on 11/28/2022. Discussed role for HPV in cervical dysplasia, need for surveillance.  Patient gave informed written consent, time out was performed.  Placed in lithotomy position. Cervix very anterior with long vaginal canal, required use of a tenaculum at anterior lip of cervix to bring cervix down into full view.  Viewed with speculum  and colposcope after application of acetic acid.   Colposcopy adequate? Yes (with difficulty)  Findings:  no visible lesions, no mosaicism, no punctation, and no abnormal vasculature; no corresponding biopsies obtained.  ECC specimen obtained. All specimens were labeled and sent to pathology.  Chaperone was present during entire procedure.  Patient was given post procedure instructions.  Will follow up pathology and manage accordingly; patient will be contacted with results and recommendations.  Routine preventative health maintenance measures emphasized.     Hildred Laser, MD [facility]

## 2022-12-27 NOTE — Patient Instructions (Signed)
Colposcopy  Colposcopy is a procedure to examine the lowest part of the uterus (cervix) for abnormalities or signs of disease. This procedure is done using an instrument that makes objects appear larger and provides light (colposcope). During the procedure, your health care provider may remove a tissue sample to look at under a microscope (biopsy). A biopsy may be done if any unusual cells are seen during the colposcopy. You may have a colposcopy if you have: An abnormal Pap smear, also called a Pap test. This screening test is used to check for signs of cancer or infection of the vagina, cervix, and uterus. An HPV (human papillomavirus) test and get a positive result for a type of HPV that puts you at high risk of cancer. Certain conditions or symptoms, such as: A sore, or lesion, on your cervix. Genital warts on your vulva, vagina, or cervix. Pain during sex. Vaginal bleeding, especially after sex. A growth on your cervix (cervical polyp) that needs to be removed. Let your health care provider know about: Any allergies you have, including allergies to medicines, latex, or iodine. All medicines you are taking, including vitamins, herbs, eye drops, creams, and over-the-counter medicines. Any bleeding problems you have. Any surgeries you have had. Any medical conditions you have, such as pelvic inflammatory disease (PID) or an endometrial disorder. The pattern of your menstrual cycles and the form of birth control (contraception) you use, if any. Your medical history, including any cervical treatments and how well you tolerated the procedure (if you have ever fainted). Whether you are pregnant or may be pregnant. What are the risks? Generally, this is a safe procedure. However, problems may occur, including: Infection. Symptoms of infection may include fever, bad-smelling vaginal discharge, or pelvic pain. Vaginal bleeding. Allergic reactions to medicines. Damage to nearby structures or  organs. What happens before the procedure? Medicines Ask your health care provider about: Changing or stopping your regular medicines. This is especially important if you are taking diabetes medicines or blood thinners. Taking medicines such as aspirin and ibuprofen. These medicines can thin your blood. Do not take these medicines unless your health care provider tells you to take them. Your health care provider will likely tell you to avoid taking aspirin, or medicine that contains aspirin, for 7 days before the procedure. Taking over-the-counter medicines, vitamins, herbs, and supplements. General instructions Tell your health care provider if you have your menstrual period now or will have it at the time of your procedure. A colposcopy is not normally done during your menstrual period. If you use contraception, continue to use it before your procedure. For 24 hours before the procedure: Do not use douche products or tampons. Do not use medicines, creams, or suppositories in the vagina. Do not have sex or insert anything into your vagina. Ask your health care provider what steps will be taken to prevent infection. What happens during the procedure? You will lie down on your back, with your feet in foot rests (stirrups). An instrument called a speculum will be inserted into your vagina. This will be used so your health care provider can see your cervix and the inside of your vagina. A cotton swab will be used to place a small amount of a liquid (solution) on the areas to be examined. This solution makes it easier to see abnormal cells. You may feel a slight burning during this part. The colposcope will be used to scan the cervix with a bright white light. The colposcope will be held near   your vulva and will make your vulva, vagina, and cervix look bigger so they can be seen better. If a biopsy is needed: You may be given a medicine to numb the area (local anesthetic). Surgical tools will be  used to remove mucus and cells through your vagina. You may feel mild pain while the tissue sample is removed. Bleeding may occur. A solution may be used to stop the bleeding. The tissue removed will be sent to a lab to be looked at under a microscope. The procedure may vary among health care providers and hospitals. What happens after the procedure? You may have some cramping in your abdomen. This should go away after a few minutes. It is up to you to get the results of your procedure. Ask your health care provider, or the department that is doing the procedure, when your results will be ready. Summary Colposcopy is a procedure to examine the lowest part of the uterus (cervix), for signs of disease. A biopsy may be done as part of the procedure. You may have some cramping in your abdomen. This should go away after a few minutes. It is up to you to get the results of your procedure. Ask your health care provider, or the department that is doing the procedure, when your results will be ready. This information is not intended to replace advice given to you by your health care provider. Make sure you discuss any questions you have with your health care provider. Document Revised: 07/11/2020 Document Reviewed: 07/11/2020 Elsevier Patient Education  2024 [employer]  

## 2023-01-01 LAB — SURGICAL PATHOLOGY

## 2023-01-03 ENCOUNTER — Telehealth: Payer: Self-pay

## 2023-01-03 NOTE — Telephone Encounter (Signed)
Pt called after hour nurse today at 10:00 wanted test results; after hour nurse gave office hours; pt declined triage.  [phone]

## 2023-01-03 NOTE — Telephone Encounter (Signed)
Patient aware. Transferred to front desk for scheduling.  Hildred Laser, MD 01/02/2023 12:11 PM EST     Please inform patient that her biopsy results showed a high grade cell change.  Needs to be seen to discuss LEEP.

## 2023-01-03 NOTE — Telephone Encounter (Signed)
Please see above

## 2023-01-17 NOTE — Telephone Encounter (Signed)
Spoke with patient. Advised need to schedule appointment to discuss LEEP prior to having. Patient notified LEEP can be done in office or outpatient. Appointment to discuss scheduled 01/23/23 @10 :35.

## 2023-01-22 NOTE — Progress Notes (Deleted)
    GYNECOLOGY PROGRESS NOTE  Subjective:    Patient ID: Laurie Hall, female    DOB: [birth year], 37 y.o.   MRN: [id number]  HPI  Patient is a 36 y.o. [id number] female who presents for consultation for LEEP. She had a pap smear on 11/28/2022 and it resulted as ASC-US with Positive High Risk HPV. She had a Colposcopy on 12/27/2022 and it resulted as CIN 2-3, High grade dysplasia. She is here today to consult about having a LEEP procedure done.   {Common ambulatory SmartLinks:19316}  Review of Systems {ros; complete:30496}   Objective:   There were no vitals taken for this visit. There is no height or weight on file to calculate BMI. General appearance: {general exam:16600} Abdomen: {abdominal exam:16834} Pelvic: {pelvic exam:16852::"cervix normal in appearance","external genitalia normal","no adnexal masses or tenderness","no cervical motion tenderness","rectovaginal septum normal","uterus normal size, shape, and consistency","vagina normal without discharge"} Extremities: {extremity exam:5109} Neurologic: {neuro exam:17854}   Assessment:   1. ASCUS with positive high risk HPV cervical   2. Severe dysplasia of cervix (CIN III)      Plan:   There are no diagnoses linked to this encounter.     Hildred Laser, MD [facility]

## 2023-01-23 ENCOUNTER — Ambulatory Visit: Payer: Medicaid Other | Admitting: Obstetrics and Gynecology

## 2023-01-23 DIAGNOSIS — D069 Carcinoma in situ of cervix, unspecified: Secondary | ICD-10-CM

## 2023-01-23 DIAGNOSIS — R87610 Atypical squamous cells of undetermined significance on cytologic smear of cervix (ASC-US): Secondary | ICD-10-CM

## 2023-01-30 NOTE — Telephone Encounter (Signed)
Patient no showed 11/26 appt to discuss LEEP. Needs to be r/s. She is scheduled for in office LEEP 12/10, but may decide to have outpatient. Needs appointment to discuss.

## 2023-02-06 ENCOUNTER — Ambulatory Visit: Payer: Medicaid Other | Admitting: Obstetrics and Gynecology

## 2023-02-06 ENCOUNTER — Telehealth: Payer: Self-pay | Admitting: Obstetrics and Gynecology

## 2023-02-06 NOTE — Progress Notes (Unsigned)
LEEP PROCEDURE NOTE  PREOPERATIVE DIAGNOSIS:   POSTOPERATIVE DIAGNOSIS:   PROCEDURE: LEEP   SURGEON: Hildred Laser, MD  IVF: ml  Urine Output: ml  EBL: Minimal  FINDINGS:   SPECIMENS:    PROCEDURE DETAILS:  Laurie Hall is a 37 y.o. y.o. [id number] female here for LEEP. No GYN concerns. Pap smear and colposcopy reviewed.    Pap  Colpo Biopsy:  ECC:   Risks, benefits, alternatives, and limitations of procedure explained to patient, including pain, bleeding, infection, failure to remove abnormal tissue and failure to cure dysplasia, need for repeat procedures, damage to pelvic organs, cervical incompetence.  Role of HPV,cervical dysplasia and need for close followup was empasized. Informed written consent was obtained. All questions were answered. Time out performed.   ??Procedure: The patient was placed in lithotomy position and the bivalved coated speculum was placed in the patient's vagina. A grounding pad placed on the patient. Lugol's solution was applied to the cervix and areas of decreased uptake were noted at *** o'clock.  Local anesthesia was administered via an intracervical block using   {NUMBERS 0-12:18577} cc of 1% Lidocaine with epinephrine. The suction was turned on and the small wire loop on 40 Watts of cutting current was used to excise the area of decreased uptake and excise the entire transformation zone. A smaller loop was used to take a top hat incision for the endocervix.  Excellent hemostasis was achieved using roller ball coagulation set at 60 Watts coagulation current. Astringent solution was then applied and the speculum was removed from the vagina. Specimens were sent to pathology.  ?The patient tolerated the procedure well. Post-operative instructions given to the patient, including instruction to seek medical attention for persistent bright red bleeding, fever, abdominal/pelvic pain, dysuria, nausea or vomiting. She was also told about the possibility of  having copious yellow to black tinged discharge for weeks. She was counseled to avoid anything in the vagina (sex/douching/tampons) for 3 weeks. She has a 2 week post-operative check to assess wound healing, review results and discuss further management.

## 2023-02-06 NOTE — Telephone Encounter (Signed)
Reached out to pt to reschedule LEEP procedure that was schedule on 02/06/2023 at 10:15 with Dr. Valentino Saxon.  Called one time and the phone was hung up.  Called a second time, could not leave a message bc mailbox was full.

## 2023-02-07 ENCOUNTER — Encounter: Payer: Self-pay | Admitting: Obstetrics and Gynecology

## 2023-02-07 NOTE — Telephone Encounter (Signed)
Reached out to pt (2x) to reschedule LEEP procedure that was scheduled on 02/06/2023 at 10:15 with Dr. Valentino Saxon.  Could not leave message bc mailbox was full.  Will send a MyChart letter.

## 2023-09-12 ENCOUNTER — Telehealth: Payer: Self-pay

## 2023-09-12 NOTE — Telephone Encounter (Signed)
 Pt calling triage to check on Rx RF request from [employer] for her incontinence. Advised we do not have request. Check w them again. Provided pt our fax #.
